# Patient Record
Sex: Female | Born: 1965 | Race: White | Hispanic: No | Marital: Married | Smoking: Never smoker
Health system: Southern US, Community
[De-identification: ages and names within clinical notes are randomized; demographics above are authoritative.]

## PROBLEM LIST (undated history)

## (undated) DIAGNOSIS — I10 Essential (primary) hypertension: Secondary | ICD-10-CM

## (undated) HISTORY — PX: ABDOMINOPLASTY: SUR9

---

## 2010-04-27 ENCOUNTER — Ambulatory Visit: Payer: Self-pay | Admitting: Gynecology

## 2010-04-29 ENCOUNTER — Ambulatory Visit: Payer: Self-pay | Admitting: Gynecology

## 2010-10-26 ENCOUNTER — Ambulatory Visit: Payer: Self-pay | Admitting: Cardiology

## 2013-02-10 ENCOUNTER — Ambulatory Visit (INDEPENDENT_AMBULATORY_CARE_PROVIDER_SITE_OTHER): Payer: 59 | Admitting: General Practice

## 2013-02-10 DIAGNOSIS — R300 Dysuria: Secondary | ICD-10-CM

## 2013-02-10 LAB — POCT URINALYSIS DIPSTICK
Bilirubin, UA: NEGATIVE
Glucose, UA: NEGATIVE
Ketones, UA: NEGATIVE
Leukocytes, UA: NEGATIVE
Nitrite, UA: NEGATIVE
Protein, UA: NEGATIVE
Spec Grav, UA: 1.02
Urobilinogen, UA: NEGATIVE
pH, UA: 6

## 2013-02-10 LAB — POCT UA - MICROSCOPIC ONLY
Bacteria, U Microscopic: NEGATIVE
Casts, Ur, LPF, POC: NEGATIVE
Crystals, Ur, HPF, POC: NEGATIVE
Mucus, UA: NEGATIVE
Yeast, UA: NEGATIVE

## 2013-02-11 NOTE — Progress Notes (Signed)
Patient ID: Bailey Valdez, female   DOB: 1965-11-12, 48 y.o.   MRN: [id number]  S: Patient complains of feeling of a suprapubic pressure sensation. She reports having a history of cystitis and would like to rule out UTI. Denies dizziness, frequency, urgency, hematuria, or foul smelling urine.   Results for orders placed in visit on 02/10/13  POCT URINALYSIS DIPSTICK      Result Value Range   Color, UA yellow     Clarity, UA clear     Glucose, UA neg     Bilirubin, UA neg     Ketones, UA neg     Spec Grav, UA 1.020     Blood, UA trace     pH, UA 6.0     Protein, UA neg     Urobilinogen, UA negative     Nitrite, UA neg     Leukocytes, UA Negative    POCT UA - MICROSCOPIC ONLY      Result Value Range   WBC, Ur, HPF, POC 1-3     RBC, urine, microscopic 1-3     Bacteria, U Microscopic neg     Mucus, UA neg     Epithelial cells, urine per micros occ     Crystals, Ur, HPF, POC neg     Casts, Ur, LPF, POC neg     Yeast, UA neg     1. Dysuria - POCT urinalysis dipstick - POCT UA - Microscopic Only  -increase fluids -RTO if symptoms worsen or unresolved, will consider antibiotic -Patient verbalized understanding Coralie Keens, FNP-C

## 2013-03-19 ENCOUNTER — Other Ambulatory Visit: Payer: Self-pay | Admitting: General Practice

## 2013-03-19 ENCOUNTER — Other Ambulatory Visit (INDEPENDENT_AMBULATORY_CARE_PROVIDER_SITE_OTHER): Payer: 59

## 2013-03-19 DIAGNOSIS — Z0000 Encounter for general adult medical examination without abnormal findings: Secondary | ICD-10-CM

## 2013-03-19 LAB — POCT CBC
Granulocyte percent: 62.5 %G (ref 37–80)
HCT, POC: 44.7 % (ref 37.7–47.9)
Hemoglobin: 14.6 g/dL (ref 12.2–16.2)
Lymph, poc: 1.8 (ref 0.6–3.4)
MCH, POC: 28.7 pg (ref 27–31.2)
MCHC: 32.8 g/dL (ref 31.8–35.4)
MCV: 87.7 fL (ref 80–97)
MPV: 8.2 fL (ref 0–99.8)
POC Granulocyte: 3.4 (ref 2–6.9)
POC LYMPH PERCENT: 34.1 %L (ref 10–50)
Platelet Count, POC: 230 10*3/uL (ref 142–424)
RBC: 5.1 M/uL (ref 4.04–5.48)
RDW, POC: 13.2 %
WBC: 5.4 10*3/uL (ref 4.6–10.2)

## 2013-03-19 NOTE — Progress Notes (Signed)
Patient came in for labs only.

## 2013-03-21 LAB — CMP14+EGFR
ALT: 28 IU/L (ref 0–32)
AST: 23 IU/L (ref 0–40)
Albumin/Globulin Ratio: 1.8 (ref 1.1–2.5)
Albumin: 4.5 g/dL (ref 3.5–5.5)
Alkaline Phosphatase: 71 IU/L (ref 39–117)
BUN/Creatinine Ratio: 20 (ref 9–23)
BUN: 17 mg/dL (ref 6–24)
CO2: 28 mmol/L (ref 18–29)
Calcium: 10 mg/dL (ref 8.7–10.2)
Chloride: 104 mmol/L (ref 97–108)
Creatinine, Ser: 0.83 mg/dL (ref 0.57–1.00)
GFR calc Af Amer: 96 mL/min/{1.73_m2} (ref >59)
GFR calc non Af Amer: 83 mL/min/{1.73_m2} (ref >59)
Globulin, Total: 2.5 g/dL (ref 1.5–4.5)
Glucose: 107 mg/dL — ABNORMAL HIGH (ref 65–99)
Potassium: 5.4 mmol/L — ABNORMAL HIGH (ref 3.5–5.2)
Sodium: 146 mmol/L — ABNORMAL HIGH (ref 134–144)
Total Bilirubin: 0.3 mg/dL (ref 0.0–1.2)
Total Protein: 7 g/dL (ref 6.0–8.5)

## 2013-03-21 LAB — NMR, LIPOPROFILE
Cholesterol: 169 mg/dL (ref <200)
HDL Cholesterol by NMR: 50 mg/dL (ref >=40)
HDL Particle Number: 34.8 umol/L (ref >=30.5)
LDL Particle Number: 1521 nmol/L — ABNORMAL HIGH (ref <1000)
LDL Size: 20.9 nm (ref >20.5)
LDLC SERPL CALC-MCNC: 104 mg/dL — ABNORMAL HIGH (ref <100)
LP-IR Score: 46 — ABNORMAL HIGH (ref <=45)
Small LDL Particle Number: 565 nmol/L — ABNORMAL HIGH (ref <=527)
Triglycerides by NMR: 73 mg/dL (ref <150)

## 2013-03-21 LAB — POCT GLYCOSYLATED HEMOGLOBIN (HGB A1C): Hemoglobin A1C: 5.2

## 2013-03-21 NOTE — Addendum Note (Signed)
Addended by: Orma Render F on: 03/21/2013 02:37 PM   Modules accepted: Orders

## 2013-03-25 ENCOUNTER — Other Ambulatory Visit: Payer: Self-pay | Admitting: General Practice

## 2013-03-25 DIAGNOSIS — H01133 Eczematous dermatitis of right eye, unspecified eyelid: Secondary | ICD-10-CM

## 2013-03-25 MED ORDER — BETAMETHASONE DIPROPIONATE AUG 0.05 % EX GEL: Freq: Two times a day (BID) | CUTANEOUS | Status: DC

## 2013-06-12 ENCOUNTER — Other Ambulatory Visit: Payer: Self-pay | Admitting: *Deleted

## 2013-06-12 MED ORDER — NITROFURANTOIN MACROCRYSTAL 100 MG PO CAPS: 100.0000 mg | ORAL_CAPSULE | Freq: Two times a day (BID) | ORAL | Status: DC

## 2013-07-16 ENCOUNTER — Encounter (INDEPENDENT_AMBULATORY_CARE_PROVIDER_SITE_OTHER): Payer: Self-pay

## 2013-07-16 ENCOUNTER — Ambulatory Visit (INDEPENDENT_AMBULATORY_CARE_PROVIDER_SITE_OTHER): Payer: 59 | Admitting: Family Medicine

## 2013-07-16 ENCOUNTER — Encounter: Payer: Self-pay | Admitting: Family Medicine

## 2013-07-16 ENCOUNTER — Ambulatory Visit (INDEPENDENT_AMBULATORY_CARE_PROVIDER_SITE_OTHER): Payer: 59

## 2013-07-16 VITALS — BP 132/74 | HR 88 | Temp 97.4°F | Ht 63.0 in | Wt 207.0 lb

## 2013-07-16 DIAGNOSIS — Z0000 Encounter for general adult medical examination without abnormal findings: Secondary | ICD-10-CM

## 2013-07-16 DIAGNOSIS — Z780 Asymptomatic menopausal state: Secondary | ICD-10-CM

## 2013-07-16 DIAGNOSIS — F4323 Adjustment disorder with mixed anxiety and depressed mood: Secondary | ICD-10-CM | POA: Insufficient documentation

## 2013-07-16 DIAGNOSIS — E8881 Metabolic syndrome: Secondary | ICD-10-CM

## 2013-07-16 DIAGNOSIS — E785 Hyperlipidemia, unspecified: Secondary | ICD-10-CM

## 2013-07-16 NOTE — Patient Instructions (Addendum)
Continue current medications. Continue good therapeutic lifestyle changes which include good diet and exercise. Fall precautions discussed with patient. If an FOBT was given today- please return it to our front desk. If you are over 50 years old - you may need Prevnar 13 or the adult Pneumonia vaccine.  We will plan to do lab work, EKG chest x-ray urinalysis, FOBT,----- after her labs are returned we will discuss diet options and weight loss measures.

## 2013-07-16 NOTE — Addendum Note (Signed)
Addended by: Magdalene RiverBULLINS, JAMIE H on: 07/16/2013 02:47 PM   Modules accepted: Orders

## 2013-07-16 NOTE — Progress Notes (Addendum)
Subjective:    Patient ID: Bailey Valdez, female    DOB: [birth year], 48 y.o.   MRN: [id number]  HPI Pt here for follow up and management of chronic medical problems. Patient has had pelvic and Pap smears which were normal this past month. She is going to be scheduled for a mammogram son. She is due to get a DEXA scan chest x-ray EKG and lab work. She is postmenopausal. Is a family history of heart disease, stroke, kidney stones rectal cancer and crohns. The patient is scheduled for an eye exam the end of the month and she gets regular dental checks.  The patient  is concerned about her weight. She has had a 15 pound weight gain since starting her antidepressant and .she does not want to change antidepressants because she is doing well on this medication.        There are no active problems to display for this patient.  Outpatient Encounter Prescriptions as of 07/16/2013  Medication Sig  . [DISCONTINUED] betamethasone, augmented, (DIPROLENE) 0.05 % gel Apply topically 2 (two) times daily.  . [DISCONTINUED] nitrofurantoin (MACRODANTIN) 100 MG capsule Take 1 capsule (100 mg total) by mouth 2 (two) times daily.    Review of Systems  Constitutional: Negative.   HENT: Negative.   Eyes: Negative.   Respiratory: Negative.   Cardiovascular: Negative.   Gastrointestinal: Negative.   Endocrine: Negative.   Genitourinary: Negative.   Musculoskeletal: Negative.   Skin: Negative.   Allergic/Immunologic: Negative.   Neurological: Negative.   Hematological: Negative.   Psychiatric/Behavioral: Negative.        Objective:   Physical Exam  Nursing note and vitals reviewed. Constitutional: She is oriented to person, place, and time. She appears well-developed and well-nourished. No distress.  HENT:  Head: Normocephalic and atraumatic.  Right Ear: External ear normal.  Left Ear: External ear normal.  Mouth/Throat: Oropharynx is clear and moist. No oropharyngeal exudate.  Congestion  bilaterally with minimal cerumen left greater than right  Eyes: Conjunctivae and EOM are normal. Pupils are equal, round, and reactive to light. Right eye exhibits no discharge. Left eye exhibits no discharge. No scleral icterus.  Neck: Normal range of motion. Neck supple. No thyromegaly present.  No carotid bruits auscultated  Cardiovascular: Normal rate, regular rhythm, normal heart sounds and intact distal pulses.  Exam reveals no gallop and no friction rub.   No murmur heard. At 96 per minute  Pulmonary/Chest: Effort normal and breath sounds normal. No respiratory distress. She has no wheezes. She has no rales. She exhibits no tenderness.  Abdominal: Soft. Bowel sounds are normal. She exhibits no mass. There is no tenderness. There is no rebound and no guarding.  Abdominal obesity  Musculoskeletal: Normal range of motion. She exhibits no edema and no tenderness.  Lymphadenopathy:    She has no cervical adenopathy.  Neurological: She is alert and oriented to person, place, and time. She has normal reflexes. No cranial nerve deficit.  Skin: Skin is warm and dry. No rash noted.  Psychiatric: She has a normal mood and affect. Her behavior is normal. Judgment and thought content normal.   BP 132/74  Pulse 88  Temp(Src) 97.4 F (36.3 C) (Oral)  Ht 5\' 3"  (1.6 m)  Wt 207 lb (93.895 kg)  BMI 36.68 kg/m2  [facility] reading (PRIMARY) by  Dr.Moore-chest x-ray-no active disease the thoracic spine appears somewhat osteopenic  Assessment & Plan:  1. Adjustment disorder with mixed anxiety and depressed mood  2. Hyperlipidemia  3. Metabolic syndrome  4. Physical exam, annual  Patient Instructions  Continue current medications. Continue good therapeutic lifestyle changes which include good diet and exercise. Fall precautions discussed with patient. If an FOBT was given today- please return it to our front desk. If you are over 50 years old - you may  need Prevnar 13 or the adult Pneumonia vaccine.  We will plan to do lab work, EKG chest x-ray urinalysis, FOBT,----- after her labs are returned we will discuss diet options and weight loss measures.   Nyra Capeson W. Moore MD

## 2013-07-17 ENCOUNTER — Other Ambulatory Visit: Payer: Self-pay | Admitting: *Deleted

## 2013-07-17 ENCOUNTER — Other Ambulatory Visit: Payer: Self-pay | Admitting: Family Medicine

## 2013-07-17 DIAGNOSIS — E785 Hyperlipidemia, unspecified: Secondary | ICD-10-CM

## 2013-07-17 DIAGNOSIS — Z0000 Encounter for general adult medical examination without abnormal findings: Secondary | ICD-10-CM

## 2013-07-17 DIAGNOSIS — F4323 Adjustment disorder with mixed anxiety and depressed mood: Secondary | ICD-10-CM

## 2013-07-17 DIAGNOSIS — Z780 Asymptomatic menopausal state: Secondary | ICD-10-CM

## 2013-07-17 DIAGNOSIS — E8881 Metabolic syndrome: Secondary | ICD-10-CM

## 2013-07-17 LAB — POCT CBC
Granulocyte percent: 63.6 %G (ref 37–80)
HCT, POC: 38.3 % (ref 37.7–47.9)
Hemoglobin: 14.4 g/dL (ref 12.2–16.2)
Lymph, poc: 1.7 (ref 0.6–3.4)
MCH, POC: 32.9 pg — AB (ref 27–31.2)
MCHC: 37.8 g/dL — AB (ref 31.8–35.4)
MCV: 87.1 fL (ref 80–97)
MPV: 8.4 fL (ref 0–99.8)
POC Granulocyte: 3.3 (ref 2–6.9)
POC LYMPH PERCENT: 32.1 %L (ref 10–50)
Platelet Count, POC: 197 10*3/uL (ref 142–424)
RBC: 4.4 M/uL (ref 4.04–5.48)
RDW, POC: 13.7 %
WBC: 5.2 10*3/uL (ref 4.6–10.2)

## 2013-07-17 NOTE — Addendum Note (Signed)
Addended by: Tommas OlpHANDY, ASHLEY N on: 07/17/2013 09:37 AM   Modules accepted: Orders

## 2013-07-18 LAB — HEPATIC FUNCTION PANEL
ALT: 34 IU/L — ABNORMAL HIGH (ref 0–32)
AST: 25 IU/L (ref 0–40)
Albumin: 4.3 g/dL (ref 3.5–5.5)
Alkaline Phosphatase: 73 IU/L (ref 39–117)
Bilirubin, Direct: 0.14 mg/dL (ref 0.00–0.40)
Total Bilirubin: 0.4 mg/dL (ref 0.0–1.2)
Total Protein: 6.9 g/dL (ref 6.0–8.5)

## 2013-07-18 LAB — THYROID PANEL
Free Thyroxine Index: 1.6 (ref 1.2–4.9)
T3 Uptake Ratio: 29 % (ref 24–39)
T4, Total: 5.6 ug/dL (ref 4.5–12.0)

## 2013-07-18 LAB — NMR, LIPOPROFILE
Cholesterol: 174 mg/dL (ref <200)
HDL Cholesterol by NMR: 46 mg/dL (ref >=40)
HDL Particle Number: 30.2 umol/L — ABNORMAL LOW (ref >=30.5)
LDL Particle Number: 1403 nmol/L — ABNORMAL HIGH (ref <1000)
LDL Size: 20.8 nm (ref >20.5)
LDLC SERPL CALC-MCNC: 108 mg/dL — ABNORMAL HIGH (ref <100)
LP-IR Score: 62 — ABNORMAL HIGH (ref <=45)
Small LDL Particle Number: 669 nmol/L — ABNORMAL HIGH (ref <=527)
Triglycerides by NMR: 100 mg/dL (ref <150)

## 2013-07-18 LAB — BMP8+EGFR
BUN/Creatinine Ratio: 15 (ref 9–23)
BUN: 13 mg/dL (ref 6–24)
CO2: 24 mmol/L (ref 18–29)
Calcium: 9.3 mg/dL (ref 8.7–10.2)
Chloride: 100 mmol/L (ref 97–108)
Creatinine, Ser: 0.84 mg/dL (ref 0.57–1.00)
GFR calc Af Amer: 94 mL/min/{1.73_m2} (ref >59)
GFR calc non Af Amer: 82 mL/min/{1.73_m2} (ref >59)
Glucose: 99 mg/dL (ref 65–99)
Potassium: 4.5 mmol/L (ref 3.5–5.2)
Sodium: 140 mmol/L (ref 134–144)

## 2013-07-18 LAB — VITAMIN D 25 HYDROXY (VIT D DEFICIENCY, FRACTURES): Vit D, 25-Hydroxy: 18.3 ng/mL — ABNORMAL LOW (ref 30.0–100.0)

## 2013-07-18 LAB — C-REACTIVE PROTEIN: CRP: 2.9 mg/L (ref 0.0–4.9)

## 2013-07-18 LAB — HEPATITIS C ANTIBODY: Hep C Virus Ab: <0.1 s/co ratio (ref 0.0–0.9)

## 2013-07-21 ENCOUNTER — Other Ambulatory Visit: Payer: Self-pay | Admitting: *Deleted

## 2013-07-21 MED ORDER — PHENTERMINE HCL 37.5 MG PO CAPS: 37.5000 mg | ORAL_CAPSULE | ORAL | Status: DC

## 2013-09-25 ENCOUNTER — Other Ambulatory Visit: Payer: Self-pay | Admitting: Orthopedic Surgery

## 2013-09-25 ENCOUNTER — Ambulatory Visit (INDEPENDENT_AMBULATORY_CARE_PROVIDER_SITE_OTHER): Payer: 59

## 2013-09-25 DIAGNOSIS — R52 Pain, unspecified: Secondary | ICD-10-CM

## 2013-09-25 DIAGNOSIS — M25519 Pain in unspecified shoulder: Secondary | ICD-10-CM

## 2013-10-08 ENCOUNTER — Ambulatory Visit: Payer: 59 | Attending: Orthopedic Surgery | Admitting: Physical Therapy

## 2013-10-08 DIAGNOSIS — M67919 Unspecified disorder of synovium and tendon, unspecified shoulder: Secondary | ICD-10-CM | POA: Diagnosis not present

## 2013-10-08 DIAGNOSIS — R5381 Other malaise: Secondary | ICD-10-CM | POA: Diagnosis not present

## 2013-10-08 DIAGNOSIS — M719 Bursopathy, unspecified: Secondary | ICD-10-CM | POA: Insufficient documentation

## 2013-10-08 DIAGNOSIS — IMO0001 Reserved for inherently not codable concepts without codable children: Secondary | ICD-10-CM | POA: Diagnosis present

## 2013-10-08 DIAGNOSIS — M25519 Pain in unspecified shoulder: Secondary | ICD-10-CM | POA: Insufficient documentation

## 2013-10-13 ENCOUNTER — Ambulatory Visit: Payer: 59 | Attending: Orthopedic Surgery | Admitting: Physical Therapy

## 2013-10-13 DIAGNOSIS — R5381 Other malaise: Secondary | ICD-10-CM | POA: Diagnosis not present

## 2013-10-13 DIAGNOSIS — M25519 Pain in unspecified shoulder: Secondary | ICD-10-CM | POA: Insufficient documentation

## 2013-10-13 DIAGNOSIS — IMO0001 Reserved for inherently not codable concepts without codable children: Secondary | ICD-10-CM | POA: Insufficient documentation

## 2013-10-13 DIAGNOSIS — M67919 Unspecified disorder of synovium and tendon, unspecified shoulder: Secondary | ICD-10-CM | POA: Diagnosis not present

## 2013-10-13 DIAGNOSIS — M719 Bursopathy, unspecified: Secondary | ICD-10-CM | POA: Insufficient documentation

## 2013-10-16 ENCOUNTER — Ambulatory Visit: Payer: 59 | Admitting: Physical Therapy

## 2013-10-16 DIAGNOSIS — IMO0001 Reserved for inherently not codable concepts without codable children: Secondary | ICD-10-CM | POA: Diagnosis not present

## 2013-10-27 ENCOUNTER — Ambulatory Visit: Payer: 59 | Admitting: Physical Therapy

## 2013-10-27 DIAGNOSIS — IMO0001 Reserved for inherently not codable concepts without codable children: Secondary | ICD-10-CM | POA: Diagnosis not present

## 2013-10-30 ENCOUNTER — Ambulatory Visit: Payer: 59 | Admitting: Physical Therapy

## 2013-10-30 ENCOUNTER — Encounter: Payer: 59 | Admitting: Physical Therapy

## 2013-10-30 DIAGNOSIS — IMO0001 Reserved for inherently not codable concepts without codable children: Secondary | ICD-10-CM | POA: Diagnosis not present

## 2013-11-04 ENCOUNTER — Ambulatory Visit: Payer: 59 | Admitting: Physical Therapy

## 2013-11-04 DIAGNOSIS — IMO0001 Reserved for inherently not codable concepts without codable children: Secondary | ICD-10-CM | POA: Diagnosis not present

## 2013-11-06 ENCOUNTER — Ambulatory Visit: Payer: 59 | Admitting: Physical Therapy

## 2013-11-06 DIAGNOSIS — IMO0001 Reserved for inherently not codable concepts without codable children: Secondary | ICD-10-CM | POA: Diagnosis not present

## 2013-11-11 ENCOUNTER — Ambulatory Visit: Payer: 59 | Admitting: Physical Therapy

## 2013-11-11 DIAGNOSIS — IMO0001 Reserved for inherently not codable concepts without codable children: Secondary | ICD-10-CM | POA: Diagnosis not present

## 2013-12-22 ENCOUNTER — Telehealth: Payer: Self-pay | Admitting: *Deleted

## 2013-12-22 MED ORDER — NITROFURANTOIN MONOHYD MACRO 100 MG PO CAPS: 100.0000 mg | ORAL_CAPSULE | Freq: Every day | ORAL | Status: DC

## 2013-12-22 NOTE — Telephone Encounter (Signed)
Pt has UTI - [facility]

## 2013-12-24 ENCOUNTER — Other Ambulatory Visit: Payer: Self-pay | Admitting: *Deleted

## 2013-12-24 MED ORDER — FLUCONAZOLE 150 MG PO TABS: ORAL_TABLET | ORAL | Status: DC

## 2013-12-25 DIAGNOSIS — N390 Urinary tract infection, site not specified: Secondary | ICD-10-CM

## 2013-12-26 DIAGNOSIS — N390 Urinary tract infection, site not specified: Secondary | ICD-10-CM

## 2014-01-02 ENCOUNTER — Other Ambulatory Visit: Payer: 59 | Admitting: *Deleted

## 2014-01-02 DIAGNOSIS — N390 Urinary tract infection, site not specified: Secondary | ICD-10-CM

## 2014-01-02 MED ORDER — CEFTRIAXONE SODIUM 500 MG IJ SOLR
500.0000 mg | Freq: Every day | INTRAMUSCULAR | Status: AC
  Administered 2013-12-25 – 2013-12-26 (×2): 500 mg via INTRAMUSCULAR

## 2014-01-02 MED ORDER — CEFTRIAXONE SODIUM 500 MG IJ SOLR: 500.0000 mg | Freq: Every day | INTRAMUSCULAR | Status: AC

## 2014-03-12 ENCOUNTER — Other Ambulatory Visit: Payer: Self-pay | Admitting: *Deleted

## 2014-03-12 MED ORDER — TOBRAMYCIN 0.3 % OP OINT: 1.0000 "application " | TOPICAL_OINTMENT | Freq: Three times a day (TID) | OPHTHALMIC | Status: DC

## 2014-03-12 NOTE — Progress Notes (Signed)
Possible pink eye rx sent to pharm

## 2014-04-02 ENCOUNTER — Encounter: Payer: Self-pay | Admitting: Internal Medicine

## 2014-04-02 ENCOUNTER — Other Ambulatory Visit: Payer: Self-pay

## 2014-04-02 DIAGNOSIS — Z1211 Encounter for screening for malignant neoplasm of colon: Secondary | ICD-10-CM

## 2014-06-02 ENCOUNTER — Ambulatory Visit (AMBULATORY_SURGERY_CENTER): Payer: Self-pay

## 2014-06-02 VITALS — Ht 63.0 in | Wt 213.0 lb

## 2014-06-02 DIAGNOSIS — Z800 Family history of malignant neoplasm of digestive organs: Secondary | ICD-10-CM

## 2014-06-02 NOTE — Progress Notes (Signed)
Per pt, no allergies to soy or egg products.Pt not taking any weight loss meds or using  O2 at home. 

## 2014-06-16 ENCOUNTER — Encounter: Payer: Self-pay | Admitting: Internal Medicine

## 2014-06-16 ENCOUNTER — Ambulatory Visit (AMBULATORY_SURGERY_CENTER): Payer: 59 | Admitting: Internal Medicine

## 2014-06-16 DIAGNOSIS — Z1211 Encounter for screening for malignant neoplasm of colon: Secondary | ICD-10-CM

## 2014-06-16 MED ORDER — SODIUM CHLORIDE 0.9 % IV SOLN: 500.0000 mL | INTRAVENOUS | Status: DC

## 2014-06-16 NOTE — Op Note (Signed)
[facility] [address] [place], [id number]   COLONOSCOPY PROCEDURE REPORT  PATIENT: Bailey HeathMartin, Kariann M  MR#: [id number] BIRTHDATE: 03-17-1966 , 48  yrs. old GENDER: female ENDOSCOPIST: Iva Booparl E Gessner, MD, [facility] PROCEDURE DATE:  06/16/2014 PROCEDURE:   Colonoscopy, screening First Screening Colonoscopy - Avg.  risk and is 50 yrs.  old or older Yes.  Prior Negative Screening - Now for repeat screening. N/A  History of Adenoma - Now for follow-up colonoscopy & has been > or = to 3 yrs.  N/A  Polyps Removed Today? No.  Polyps Removed Today? No.  Recommend repeat exam, <10 yrs? Polyps Removed Today? No.  Recommend repeat exam, <10 yrs? No. ASA CLASS:   Class II INDICATIONS:first colonoscopy and average risk for colorectal cancer. MEDICATIONS: Propofol 300 mg IV and Monitored anesthesia care  DESCRIPTION OF PROCEDURE:   After the risks benefits and alternatives of the procedure were thoroughly explained, informed consent was obtained.  The digital rectal exam revealed no abnormalities of the rectum.   The LB [id number]-H190 [id number]  endoscope was introduced through the anus and advanced to the cecum, which was identified by both the appendix and ileocecal valve. No adverse events experienced.   The quality of the prep was excellent, using MiraLax  The instrument was then slowly withdrawn as the colon was fully examined.      COLON FINDINGS: A normal appearing cecum, ileocecal valve, and appendiceal orifice were identified.  The ascending, transverse, descending, sigmoid colon, and rectum appeared unremarkable. Retroflexed views revealed no abnormalities. The time to cecum=5 minutes 18 seconds.  Withdrawal time=10 minutes 16 seconds.  The scope was withdrawn and the procedure completed. COMPLICATIONS: There were no immediate complications.  ENDOSCOPIC IMPRESSION: Normal colonoscopy - excellent prep  RECOMMENDATIONS: Repeat Colonscopy in 10 years -2026  eSigned:   Iva Booparl E Gessner, MD, [facility] 06/16/2014 10:16 AM   cc: the Patient and Rudi Heaponald Moore, MD

## 2014-06-16 NOTE — Patient Instructions (Addendum)
Your colonoscopy was normal!  Next routine colonoscopy in 10 years - 2026  I appreciate the opportunity to care for you. Iva Booparl E. Gessner, MD, [facility]   Discharge instructions given. Normal exam. Resume previous medications. YOU HAD AN ENDOSCOPIC PROCEDURE TODAY AT THE [facility]: Refer to the procedure report that was given to you for any specific questions about what was found during the examination.  If the procedure report does not answer your questions, please call your gastroenterologist to clarify.  If you requested that your care partner not be given the details of your procedure findings, then the procedure report has been included in a sealed envelope for you to review at your convenience later.  YOU SHOULD EXPECT: Some feelings of bloating in the abdomen. Passage of more gas than usual.  Walking can help get rid of the air that was put into your GI tract during the procedure and reduce the bloating. If you had a lower endoscopy (such as a colonoscopy or flexible sigmoidoscopy) you may notice spotting of blood in your stool or on the toilet paper. If you underwent a bowel prep for your procedure, then you may not have a normal bowel movement for a few days.  DIET: Your first meal following the procedure should be a light meal and then it is ok to progress to your normal diet.  A half-sandwich or bowl of soup is an example of a good first meal.  Heavy or fried foods are harder to digest and may make you feel nauseous or bloated.  Likewise meals heavy in dairy and vegetables can cause extra gas to form and this can also increase the bloating.  Drink plenty of fluids but you should avoid alcoholic beverages for 24 hours.  ACTIVITY: Your care partner should take you home directly after the procedure.  You should plan to take it easy, moving slowly for the rest of the day.  You can resume normal activity the day after the procedure however you should NOT DRIVE or use heavy  machinery for 24 hours (because of the sedation medicines used during the test).    SYMPTOMS TO REPORT IMMEDIATELY: A gastroenterologist can be reached at any hour.  During normal business hours, 8:30 AM to 5:00 PM Monday through Friday, call [phone].  After hours and on weekends, please call the GI answering service at [phone] who will take a message and have the physician on call contact you.   Following lower endoscopy (colonoscopy or flexible sigmoidoscopy):  Excessive amounts of blood in the stool  Significant tenderness or worsening of abdominal pains  Swelling of the abdomen that is new, acute  Fever of 100F or higher  FOLLOW UP: If any biopsies were taken you will be contacted by phone or by letter within the next 1-3 weeks.  Call your gastroenterologist if you have not heard about the biopsies in 3 weeks.  Our staff will call the home number listed on your records the next business day following your procedure to check on you and address any questions or concerns that you may have at that time regarding the information given to you following your procedure. This is a courtesy call and so if there is no answer at the home number and we have not heard from you through the emergency physician on call, we will assume that you have returned to your regular daily activities without incident.  SIGNATURES/CONFIDENTIALITY: You and/or your care partner have signed paperwork which will  be entered into your electronic medical record.  These signatures attest to the fact that that the information above on your After Visit Summary has been reviewed and is understood.  Full responsibility of the confidentiality of this discharge information lies with you and/or your care-partner. 

## 2014-06-16 NOTE — Progress Notes (Signed)
Report to PACU, RN, vss, BBS= Clear.  

## 2014-06-17 ENCOUNTER — Telehealth: Payer: Self-pay | Admitting: *Deleted

## 2014-06-17 NOTE — Telephone Encounter (Signed)
  Follow up Call-  Call back number 06/16/2014  Post procedure Call Back phone  # [phone]  Permission to leave phone message Yes     Patient questions:  Do you have a fever, pain , or abdominal swelling? No. Pain Score  0 *  Have you tolerated food without any problems? Yes.    Have you been able to return to your normal activities? Yes.    Do you have any questions about your discharge instructions: Diet   No. Medications  No. Follow up visit  No.  Do you have questions or concerns about your Care? No.  Actions: * If pain score is 4 or above: No action needed, pain <4.

## 2014-09-01 ENCOUNTER — Other Ambulatory Visit: Payer: Self-pay | Admitting: *Deleted

## 2014-09-01 MED ORDER — TRIAMCINOLONE ACETONIDE 55 MCG/ACT NA AERO: 2.0000 | INHALATION_SPRAY | Freq: Every day | NASAL | Status: DC

## 2014-09-01 MED ORDER — FLUTICASONE PROPIONATE 50 MCG/ACT NA SUSP: 2.0000 | Freq: Every day | NASAL | Status: DC

## 2014-10-16 ENCOUNTER — Other Ambulatory Visit: Payer: 59

## 2014-10-16 ENCOUNTER — Other Ambulatory Visit: Payer: Self-pay | Admitting: *Deleted

## 2014-10-16 DIAGNOSIS — R21 Rash and other nonspecific skin eruption: Secondary | ICD-10-CM

## 2014-10-18 LAB — AEROBIC CULTURE

## 2014-10-29 ENCOUNTER — Other Ambulatory Visit: Payer: Self-pay

## 2014-10-29 DIAGNOSIS — M3399 Dermatopolymyositis, unspecified with other organ involvement: Principal | ICD-10-CM

## 2014-10-29 DIAGNOSIS — M3390 Dermatopolymyositis, unspecified, organ involvement unspecified: Secondary | ICD-10-CM

## 2015-03-09 ENCOUNTER — Other Ambulatory Visit: Payer: Self-pay | Admitting: *Deleted

## 2015-03-09 DIAGNOSIS — R399 Unspecified symptoms and signs involving the genitourinary system: Secondary | ICD-10-CM

## 2015-03-19 ENCOUNTER — Other Ambulatory Visit: Payer: Self-pay | Admitting: *Deleted

## 2015-03-19 MED ORDER — FLUCONAZOLE 150 MG PO TABS: 150.0000 mg | ORAL_TABLET | Freq: Once | ORAL | Status: DC

## 2015-03-26 ENCOUNTER — Other Ambulatory Visit: Payer: 59

## 2015-03-26 ENCOUNTER — Other Ambulatory Visit: Payer: Self-pay | Admitting: *Deleted

## 2015-03-26 DIAGNOSIS — N390 Urinary tract infection, site not specified: Secondary | ICD-10-CM

## 2015-03-26 MED ORDER — MACROBID 100 MG PO CAPS: 100.0000 mg | ORAL_CAPSULE | Freq: Two times a day (BID) | ORAL | Status: DC

## 2015-03-26 NOTE — Progress Notes (Signed)
lab only.

## 2015-03-27 LAB — URINE CULTURE

## 2015-05-20 ENCOUNTER — Telehealth: Payer: Self-pay | Admitting: Pediatrics

## 2015-05-20 DIAGNOSIS — R399 Unspecified symptoms and signs involving the genitourinary system: Secondary | ICD-10-CM

## 2015-05-20 MED ORDER — MACROBID 100 MG PO CAPS: 100.0000 mg | ORAL_CAPSULE | Freq: Two times a day (BID) | ORAL | Status: DC

## 2015-05-20 NOTE — Telephone Encounter (Signed)
UTI symptoms starting yesterday, similar to prior UTI symptoms.

## 2015-05-23 ENCOUNTER — Other Ambulatory Visit: Payer: Self-pay | Admitting: Family

## 2015-05-23 MED ORDER — CIPROFLOXACIN HCL 500 MG PO TABS: 500.0000 mg | ORAL_TABLET | Freq: Two times a day (BID) | ORAL | Status: DC

## 2015-05-23 MED ORDER — FLUCONAZOLE 150 MG PO TABS: 150.0000 mg | ORAL_TABLET | ORAL | Status: DC

## 2015-07-08 ENCOUNTER — Other Ambulatory Visit: Payer: Self-pay | Admitting: Family

## 2015-07-08 MED ORDER — PHENTERMINE HCL 37.5 MG PO CAPS: 37.5000 mg | ORAL_CAPSULE | ORAL | Status: DC

## 2015-08-05 ENCOUNTER — Ambulatory Visit (INDEPENDENT_AMBULATORY_CARE_PROVIDER_SITE_OTHER): Payer: 59

## 2015-08-05 ENCOUNTER — Ambulatory Visit (INDEPENDENT_AMBULATORY_CARE_PROVIDER_SITE_OTHER): Payer: 59 | Admitting: Pediatrics

## 2015-08-05 ENCOUNTER — Encounter: Payer: Self-pay | Admitting: Pediatrics

## 2015-08-05 VITALS — BP 114/72 | HR 87 | Temp 97.0°F | Ht 63.0 in | Wt 194.0 lb

## 2015-08-05 DIAGNOSIS — M545 Low back pain: Secondary | ICD-10-CM | POA: Diagnosis not present

## 2015-08-05 LAB — URINALYSIS, COMPLETE
Bilirubin, UA: NEGATIVE
Glucose, UA: NEGATIVE
Ketones, UA: NEGATIVE
Nitrite, UA: NEGATIVE
Protein, UA: NEGATIVE
Specific Gravity, UA: 1.03 (ref 1.005–1.030)
Urobilinogen, Ur: 0.2 mg/dL (ref 0.2–1.0)
pH, UA: 5.5 (ref 5.0–7.5)

## 2015-08-05 LAB — MICROSCOPIC EXAMINATION: Epithelial Cells (non renal): >10 /hpf — AB (ref 0–10)

## 2015-08-05 MED ORDER — METHOCARBAMOL 500 MG PO TABS: 500.0000 mg | ORAL_TABLET | Freq: Three times a day (TID) | ORAL | Status: DC | PRN

## 2015-08-05 NOTE — Addendum Note (Signed)
Addended by: Johna SheriffVINCENT, Gwynn Crossley L on: 08/05/2015 02:22 PM   Modules accepted: Orders

## 2015-08-05 NOTE — Progress Notes (Addendum)
    Subjective:    Patient ID: Wardell HeathMary M Supan, female    DOB: 02/29/1966, 50 y.o.   MRN: [id number]  CC: Back Pain   HPI: Wardell HeathMary M Gentile is a 50 y.o. female presenting for Back Pain  Woke her up at 3am with pain, has been constant since then Present R flank, pain remits slightly when she presses on it Sometimes position changes help slightly 6/10 pain now No fevers, no dysuria, normal appetite, no nausea Occasionally has sharp shooting pain but otherwise just constant pain with no relief since starting hasnt taken anything yet for pain No personal h/o kidney stones, pt's mother with kidney stones Does have interstitial cystitis, oftne with RBCs in urine Regularly working out, last a few days ago, doesn't remember hurting back No pain shooting down leg    Relevant past medical, surgical, family and social history reviewed and updated as indicated. Interim medical history since our last visit reviewed. Allergies and medications reviewed and updated.  ROS: Per HPI unless specifically indicated above      Objective:    BP 114/72 mmHg  Pulse 87  Temp(Src) 97 F (36.1 C) (Oral)  Ht 5\' 3"  (1.6 m)  Wt 194 lb (87.998 kg)  BMI 34.37 kg/m2  Wt Readings from Last 3 Encounters:  08/05/15 194 lb (87.998 kg)  06/16/14 213 lb (96.616 kg)  06/02/14 213 lb (96.616 kg)     Gen: NAD, alert, cooperative with exam, NCAT EYES: EOMI, no scleral injection or icterus CV: WWP Resp:  normal WOB Abd: soft, NTND. no guarding or organomegaly, no peritoneal signs, + R CVA tenderness, neg psoas/obturator Ext: No edema, warm Neuro: Alert and oriented, strength LE normal MSK: normal ROM back, no worsening of pain with movement, neg SLR b/l     Assessment & Plan:    Corrie DandyMary was seen today for R flank pain. Will get KUB, test urine for infection. Try muscle relaxer, NSAID tonight, CT scan renal to look for stone if no improvement by tomorrow.  UA with some RBCs, 10-30 WBCs, will send for  culture.  Diagnoses and all orders for this visit:  Low back pain, unspecified back pain laterality, with sciatica presence unspecified -     Urinalysis, Complete -     DG Abd 1 View; Future  toradol 60mg  IM given in clinic Robaxin 500mg  #30 tabs take 1-2 q8h prn sent in  Follow up plan: As needed  Rex Krasarol Vincent, MD [facility] 08/05/2015, 1:04 PM

## 2015-08-05 NOTE — Addendum Note (Signed)
Addended by: Johna SheriffVINCENT, CAROL L on: 08/05/2015 03:16 PM   Modules accepted: Orders

## 2015-08-06 LAB — URINE CULTURE

## 2015-11-25 ENCOUNTER — Other Ambulatory Visit: Payer: Self-pay | Admitting: Family

## 2015-11-25 DIAGNOSIS — R399 Unspecified symptoms and signs involving the genitourinary system: Secondary | ICD-10-CM

## 2015-11-25 MED ORDER — MACROBID 100 MG PO CAPS: 100.0000 mg | ORAL_CAPSULE | Freq: Two times a day (BID) | ORAL | Status: DC

## 2015-12-03 ENCOUNTER — Encounter: Payer: Self-pay | Admitting: Family

## 2015-12-03 ENCOUNTER — Ambulatory Visit (INDEPENDENT_AMBULATORY_CARE_PROVIDER_SITE_OTHER): Payer: 59 | Admitting: Family

## 2015-12-03 VITALS — BP 128/84 | HR 92 | Temp 97.6°F | Ht 63.0 in | Wt 198.0 lb

## 2015-12-03 DIAGNOSIS — F4323 Adjustment disorder with mixed anxiety and depressed mood: Secondary | ICD-10-CM

## 2015-12-03 DIAGNOSIS — Z01419 Encounter for gynecological examination (general) (routine) without abnormal findings: Secondary | ICD-10-CM | POA: Diagnosis not present

## 2015-12-03 DIAGNOSIS — Z0000 Encounter for general adult medical examination without abnormal findings: Secondary | ICD-10-CM | POA: Diagnosis not present

## 2015-12-03 DIAGNOSIS — E669 Obesity, unspecified: Secondary | ICD-10-CM | POA: Insufficient documentation

## 2015-12-03 DIAGNOSIS — Z1239 Encounter for other screening for malignant neoplasm of breast: Secondary | ICD-10-CM

## 2015-12-03 DIAGNOSIS — E8881 Metabolic syndrome: Secondary | ICD-10-CM

## 2015-12-03 DIAGNOSIS — E785 Hyperlipidemia, unspecified: Secondary | ICD-10-CM

## 2015-12-03 MED ORDER — LIRAGLUTIDE -WEIGHT MANAGEMENT 18 MG/3ML ~~LOC~~ SOPN: 3.0000 mg | PEN_INJECTOR | Freq: Every day | SUBCUTANEOUS | Status: DC

## 2015-12-03 MED ORDER — PEN NEEDLES 31G X 6 MM MISC: 1.0000 | Freq: Every day | Status: DC

## 2015-12-03 NOTE — Progress Notes (Signed)
Subjective:    Patient ID: Bailey Valdez, female    DOB: 1966-03-10, 50 y.o.   MRN: [id number]  PT present to the office today for CPE with pap.  Gynecologic Exam The patient's pertinent negatives include no vaginal discharge. Pertinent negatives include no headaches.  Anxiety Presents for follow-up visit. Onset was 1 to 5 years ago. The problem has been waxing and waning. Patient reports no decreased concentration, depressed mood, excessive worry, insomnia, nervous/anxious behavior, palpitations, restlessness or shortness of breath. Symptoms occur occasionally. The severity of symptoms is moderate. The quality of sleep is good.   Her past medical history is significant for anxiety/panic attacks. Past treatments include SSRIs. The treatment provided moderate relief. Compliance with prior treatments has been good.  Hyperlipidemia This is a chronic problem. The current episode started more than 1 year ago. The problem is controlled. Recent lipid tests were reviewed and are normal. Exacerbating diseases include obesity. Pertinent negatives include no shortness of breath. Current antihyperlipidemic treatment includes diet change. The current treatment provides moderate improvement of lipids. Risk factors for coronary artery disease include dyslipidemia, obesity, a sedentary lifestyle and family history.      Review of Systems  Constitutional: Negative.   HENT: Negative.   Eyes: Negative.   Respiratory: Negative.  Negative for shortness of breath.   Cardiovascular: Negative.  Negative for palpitations.  Gastrointestinal: Negative.   Endocrine: Negative.   Genitourinary: Negative.  Negative for vaginal discharge.  Musculoskeletal: Negative.   Neurological: Negative.  Negative for headaches.  Hematological: Negative.   Psychiatric/Behavioral: Negative.  Negative for decreased concentration. The patient is not nervous/anxious and does not have insomnia.   All other systems reviewed and are  negative.      Objective:   Physical Exam  Constitutional: She is oriented to person, place, and time. She appears well-developed and well-nourished. No distress.  HENT:  Head: Normocephalic and atraumatic.  Right Ear: External ear normal.  Left Ear: External ear normal.  Nose: Nose normal.  Mouth/Throat: Oropharynx is clear and moist.  Eyes: Pupils are equal, round, and reactive to light.  Neck: Normal range of motion. Neck supple. No thyromegaly present.  Cardiovascular: Normal rate, regular rhythm, normal heart sounds and intact distal pulses.   No murmur heard. Pulmonary/Chest: Effort normal and breath sounds normal. No respiratory distress. She has no wheezes. Right breast exhibits no inverted nipple, no mass, no nipple discharge, no skin change and no tenderness. Left breast exhibits no inverted nipple, no mass, no nipple discharge, no skin change and no tenderness. Breasts are symmetrical.  Abdominal: Soft. Bowel sounds are normal. She exhibits no distension. There is no tenderness.  Genitourinary: Vagina normal.  Bimanual exam- no adnexal masses or tenderness, ovaries nonpalpable   Cervix parous and pink- No discharge   Musculoskeletal: Normal range of motion. She exhibits no edema or tenderness.  Neurological: She is alert and oriented to person, place, and time. She has normal reflexes. No cranial nerve deficit.  Skin: Skin is warm and dry.  Psychiatric: She has a normal mood and affect. Her behavior is normal. Judgment and thought content normal.  Vitals reviewed.     BP 128/84 mmHg  Pulse 92  Temp(Src) 97.6 F (36.4 C) (Oral)  Ht _0  (1.6 m)  Wt 198 lb (89.812 kg)  BMI 35.08 kg/m2     Assessment & Plan:  1. Adjustment disorder with mixed anxiety and depressed mood - CMP14+EGFR  2. Hyperlipidemia - RWE31+VQMG  3. Metabolic syndrome -  CMP14+EGFR - Liraglutide -Weight Management (SAXENDA) 18 MG/3ML SOPN; Inject 3 mg into the skin daily.  Dispense: 15 mL;  Refill: 1 - Insulin Pen Needle (PEN NEEDLES) 31G X 6 MM MISC; 1 each by Does not apply route daily.  Dispense: 100 each; Refill: 0  4. Obesity (BMI 30-39.9) - CMP14+EGFR - Liraglutide -Weight Management (SAXENDA) 18 MG/3ML SOPN; Inject 3 mg into the skin daily.  Dispense: 15 mL; Refill: 1 - Insulin Pen Needle (PEN NEEDLES) 31G X 6 MM MISC; 1 each by Does not apply route daily.  Dispense: 100 each; Refill: 0  5. Annual physical exam - Anemia Profile B - CMP14+EGFR - Lipid panel - Thyroid Panel With TSH - VITAMIN D 25 Hydroxy (Vit-D Deficiency, Fractures) - Pap IG w/ reflex to HPV when ASC-U  6. Encounter for routine gynecological examination - CMP14+EGFR - Pap IG w/ reflex to HPV when ASC-U  7. Breast cancer screening - [facility] MAMMOGRAPHY   Continue all meds Labs pending Health Maintenance reviewed Diet and exercise encouraged RTO 1 year  Evelina Dun, FNP

## 2015-12-03 NOTE — Patient Instructions (Signed)
Health Maintenance, Female Adopting a healthy lifestyle and getting preventive care can go a long way to promote health and wellness. Talk with your health care provider about what schedule of regular examinations is right for you. This is a good chance for you to check in with your provider about disease prevention and staying healthy. In between checkups, there are plenty of things you can do on your own. Experts have done a lot of research about which lifestyle changes and preventive measures are most likely to keep you healthy. Ask your health care provider for more information. WEIGHT AND DIET  Eat a healthy diet  Be sure to include plenty of vegetables, fruits, low-fat dairy products, and lean protein.  Do not eat a lot of foods high in solid fats, added sugars, or salt.  Get regular exercise. This is one of the most important things you can do for your health.  Most adults should exercise for at least 150 minutes each week. The exercise should increase your heart rate and make you sweat (moderate-intensity exercise).  Most adults should also do strengthening exercises at least twice a week. This is in addition to the moderate-intensity exercise.  Maintain a healthy weight  Body mass index (BMI) is a measurement that can be used to identify possible weight problems. It estimates body fat based on height and weight. Your health care provider can help determine your BMI and help you achieve or maintain a healthy weight.  For females 20 years of age and older:   A BMI below 18.5 is considered underweight.  A BMI of 18.5 to 24.9 is normal.  A BMI of 25 to 29.9 is considered overweight.  A BMI of 30 and above is considered obese.  Watch levels of cholesterol and blood lipids  You should start having your blood tested for lipids and cholesterol at 52 years of age, then have this test every 5 years.  You may need to have your cholesterol levels checked more often if:  Your lipid  or cholesterol levels are high.  You are older than 52 years of age.  You are at high risk for heart disease.  CANCER SCREENING   Lung Cancer  Lung cancer screening is recommended for adults 55-80 years old who are at high risk for lung cancer because of a history of smoking.  A yearly low-dose CT scan of the lungs is recommended for people who:  Currently smoke.  Have quit within the past 15 years.  Have at least a 30-pack-year history of smoking. A pack year is smoking an average of one pack of cigarettes a day for 1 year.  Yearly screening should continue until it has been 15 years since you quit.  Yearly screening should stop if you develop a health problem that would prevent you from having lung cancer treatment.  Breast Cancer  Practice breast self-awareness. This means understanding how your breasts normally appear and feel.  It also means doing regular breast self-exams. Let your health care provider know about any changes, no matter how small.  If you are in your 20s or 30s, you should have a clinical breast exam (CBE) by a health care provider every 1-3 years as part of a regular health exam.  If you are 40 or older, have a CBE every year. Also consider having a breast X-ray (mammogram) every year.  If you have a family history of breast cancer, talk to your health care provider about genetic screening.  If you   are at high risk for breast cancer, talk to your health care provider about having an MRI and a mammogram every year.  Breast cancer gene (BRCA) assessment is recommended for women who have family members with BRCA-related cancers. BRCA-related cancers include:  Breast.  Ovarian.  Tubal.  Peritoneal cancers.  Results of the assessment will determine the need for genetic counseling and BRCA1 and BRCA2 testing. Cervical Cancer Your health care provider may recommend that you be screened regularly for cancer of the pelvic organs (ovaries, uterus, and  vagina). This screening involves a pelvic examination, including checking for microscopic changes to the surface of your cervix (Pap test). You may be encouraged to have this screening done every 3 years, beginning at age 21.  For women ages 30-65, health care providers may recommend pelvic exams and Pap testing every 3 years, or they may recommend the Pap and pelvic exam, combined with testing for human papilloma virus (HPV), every 5 years. Some types of HPV increase your risk of cervical cancer. Testing for HPV may also be done on women of any age with unclear Pap test results.  Other health care providers may not recommend any screening for nonpregnant women who are considered low risk for pelvic cancer and who do not have symptoms. Ask your health care provider if a screening pelvic exam is right for you.  If you have had past treatment for cervical cancer or a condition that could lead to cancer, you need Pap tests and screening for cancer for at least 20 years after your treatment. If Pap tests have been discontinued, your risk factors (such as having a new sexual partner) need to be reassessed to determine if screening should resume. Some women have medical problems that increase the chance of getting cervical cancer. In these cases, your health care provider may recommend more frequent screening and Pap tests. Colorectal Cancer  This type of cancer can be detected and often prevented.  Routine colorectal cancer screening usually begins at 52 years of age and continues through 52 years of age.  Your health care provider may recommend screening at an earlier age if you have risk factors for colon cancer.  Your health care provider may also recommend using home test kits to check for hidden blood in the stool.  A small camera at the end of a tube can be used to examine your colon directly (sigmoidoscopy or colonoscopy). This is done to check for the earliest forms of colorectal  cancer.  Routine screening usually begins at age 50.  Direct examination of the colon should be repeated every 5-10 years through 52 years of age. However, you may need to be screened more often if early forms of precancerous polyps or small growths are found. Skin Cancer  Check your skin from head to toe regularly.  Tell your health care provider about any new moles or changes in moles, especially if there is a change in a mole's shape or color.  Also tell your health care provider if you have a mole that is larger than the size of a pencil eraser.  Always use sunscreen. Apply sunscreen liberally and repeatedly throughout the day.  Protect yourself by wearing long sleeves, pants, a wide-brimmed hat, and sunglasses whenever you are outside. HEART DISEASE, DIABETES, AND HIGH BLOOD PRESSURE   High blood pressure causes heart disease and increases the risk of stroke. High blood pressure is more likely to develop in:  People who have blood pressure in the high end   of the normal range (130-139/85-89 mm Hg).  People who are overweight or obese.  People who are African American.  If you are 38-23 years of age, have your blood pressure checked every 3-5 years. If you are 61 years of age or older, have your blood pressure checked every year. You should have your blood pressure measured twice--once when you are at a hospital or clinic, and once when you are not at a hospital or clinic. Record the average of the two measurements. To check your blood pressure when you are not at a hospital or clinic, you can use:  An automated blood pressure machine at a pharmacy.  A home blood pressure monitor.  If you are between 45 years and 39 years old, ask your health care provider if you should take aspirin to prevent strokes.  Have regular diabetes screenings. This involves taking a blood sample to check your fasting blood sugar level.  If you are at a normal weight and have a low risk for diabetes,  have this test once every three years after 52 years of age.  If you are overweight and have a high risk for diabetes, consider being tested at a younger age or more often. PREVENTING INFECTION  Hepatitis B  If you have a higher risk for hepatitis B, you should be screened for this virus. You are considered at high risk for hepatitis B if:  You were born in a country where hepatitis B is common. Ask your health care provider which countries are considered high risk.  Your parents were born in a high-risk country, and you have not been immunized against hepatitis B (hepatitis B vaccine).  You have HIV or AIDS.  You use needles to inject street drugs.  You live with someone who has hepatitis B.  You have had sex with someone who has hepatitis B.  You get hemodialysis treatment.  You take certain medicines for conditions, including cancer, organ transplantation, and autoimmune conditions. Hepatitis C  Blood testing is recommended for:  Everyone born from [birth year] through 1967.  Anyone with known risk factors for hepatitis C. Sexually transmitted infections (STIs)  You should be screened for sexually transmitted infections (STIs) including gonorrhea and chlamydia if:  You are sexually active and are younger than 52 years of age.  You are older than 53 years of age and your health care provider tells you that you are at risk for this type of infection.  Your sexual activity has changed since you were last screened and you are at an increased risk for chlamydia or gonorrhea. Ask your health care provider if you are at risk.  If you do not have HIV, but are at risk, it may be recommended that you take a prescription medicine daily to prevent HIV infection. This is called pre-exposure prophylaxis (PrEP). You are considered at risk if:  You are sexually active and do not regularly use condoms or know the HIV status of your partner(s).  You take drugs by injection.  You are sexually  active with a partner who has HIV. Talk with your health care provider about whether you are at high risk of being infected with HIV. If you choose to begin PrEP, you should first be tested for HIV. You should then be tested every 3 months for as long as you are taking PrEP.  PREGNANCY   If you are premenopausal and you may become pregnant, ask your health care provider about preconception counseling.  If you may  become pregnant, take 400 to 800 micrograms (mcg) of folic acid every day.  If you want to prevent pregnancy, talk to your health care provider about birth control (contraception). OSTEOPOROSIS AND MENOPAUSE   Osteoporosis is a disease in which the bones lose minerals and strength with aging. This can result in serious bone fractures. Your risk for osteoporosis can be identified using a bone density scan.  If you are 61 years of age or older, or if you are at risk for osteoporosis and fractures, ask your health care provider if you should be screened.  Ask your health care provider whether you should take a calcium or vitamin D supplement to lower your risk for osteoporosis.  Menopause may have certain physical symptoms and risks.  Hormone replacement therapy may reduce some of these symptoms and risks. Talk to your health care provider about whether hormone replacement therapy is right for you.  HOME CARE INSTRUCTIONS   Schedule regular health, dental, and eye exams.  Stay current with your immunizations.   Do not use any tobacco products including cigarettes, chewing tobacco, or electronic cigarettes.  If you are pregnant, do not drink alcohol.  If you are breastfeeding, limit how much and how often you drink alcohol.  Limit alcohol intake to no more than 1 drink per day for nonpregnant women. One drink equals 12 ounces of beer, 5 ounces of wine, or 1 ounces of hard liquor.  Do not use street drugs.  Do not share needles.  Ask your health care provider for help if  you need support or information about quitting drugs.  Tell your health care provider if you often feel depressed.  Tell your health care provider if you have ever been abused or do not feel safe at home.   This information is not intended to replace advice given to you by your health care provider. Make sure you discuss any questions you have with your health care provider.   Document Released: 11/14/2010 Document Revised: 05/22/2014 Document Reviewed: 04/02/2013 Elsevier Interactive Patient Education [employer].

## 2015-12-06 LAB — PAP IG W/ RFLX HPV ASCU: PAP Smear Comment: 0

## 2015-12-06 MED FILL — UNIFINE PENTIPS 31GX3/16: 31G X 5 MM | 90 days supply | Qty: 100 | Fill #0

## 2015-12-06 MED FILL — SAXENDA 18 MG/3 ML PEN: 18 | 30 days supply | Qty: 15 | Fill #0

## 2015-12-07 ENCOUNTER — Telehealth: Payer: Self-pay

## 2015-12-07 ENCOUNTER — Other Ambulatory Visit: Payer: 59

## 2015-12-07 DIAGNOSIS — Z01419 Encounter for gynecological examination (general) (routine) without abnormal findings: Secondary | ICD-10-CM | POA: Diagnosis not present

## 2015-12-07 DIAGNOSIS — Z0000 Encounter for general adult medical examination without abnormal findings: Secondary | ICD-10-CM | POA: Diagnosis not present

## 2015-12-07 DIAGNOSIS — E785 Hyperlipidemia, unspecified: Secondary | ICD-10-CM | POA: Diagnosis not present

## 2015-12-07 DIAGNOSIS — E669 Obesity, unspecified: Secondary | ICD-10-CM | POA: Diagnosis not present

## 2015-12-07 DIAGNOSIS — E8881 Metabolic syndrome: Secondary | ICD-10-CM | POA: Diagnosis not present

## 2015-12-07 DIAGNOSIS — F4323 Adjustment disorder with mixed anxiety and depressed mood: Secondary | ICD-10-CM | POA: Diagnosis not present

## 2015-12-08 ENCOUNTER — Other Ambulatory Visit: Payer: Self-pay | Admitting: Family

## 2015-12-08 LAB — CMP14+EGFR
ALT: 24 IU/L (ref 0–32)
AST: 16 IU/L (ref 0–40)
Albumin/Globulin Ratio: 1.5 (ref 1.2–2.2)
Albumin: 4 g/dL (ref 3.5–5.5)
Alkaline Phosphatase: 73 IU/L (ref 39–117)
BUN/Creatinine Ratio: 19 (ref 9–23)
BUN: 15 mg/dL (ref 6–24)
Bilirubin Total: 0.4 mg/dL (ref 0.0–1.2)
CO2: 25 mmol/L (ref 18–29)
Calcium: 9 mg/dL (ref 8.7–10.2)
Chloride: 102 mmol/L (ref 96–106)
Creatinine, Ser: 0.78 mg/dL (ref 0.57–1.00)
GFR calc Af Amer: 101 mL/min/{1.73_m2} (ref >59)
GFR calc non Af Amer: 88 mL/min/{1.73_m2} (ref >59)
Globulin, Total: 2.7 g/dL (ref 1.5–4.5)
Glucose: 93 mg/dL (ref 65–99)
Potassium: 5 mmol/L (ref 3.5–5.2)
Sodium: 142 mmol/L (ref 134–144)
Total Protein: 6.7 g/dL (ref 6.0–8.5)

## 2015-12-08 LAB — ANEMIA PROFILE B
Basophils Absolute: 0 10*3/uL (ref 0.0–0.2)
Basos: 1 %
EOS (ABSOLUTE): 0.1 10*3/uL (ref 0.0–0.4)
Eos: 2 %
Ferritin: 169 ng/mL — ABNORMAL HIGH (ref 15–150)
Folate: 11.2 ng/mL (ref >3.0)
Hematocrit: 42.6 % (ref 34.0–46.6)
Hemoglobin: 14.1 g/dL (ref 11.1–15.9)
Immature Grans (Abs): 0 10*3/uL (ref 0.0–0.1)
Immature Granulocytes: 0 %
Iron Saturation: 38 % (ref 15–55)
Iron: 116 ug/dL (ref 27–159)
Lymphocytes Absolute: 1.7 10*3/uL (ref 0.7–3.1)
Lymphs: 33 %
MCH: 29.1 pg (ref 26.6–33.0)
MCHC: 33.1 g/dL (ref 31.5–35.7)
MCV: 88 fL (ref 79–97)
Monocytes Absolute: 0.4 10*3/uL (ref 0.1–0.9)
Monocytes: 7 %
Neutrophils Absolute: 2.9 10*3/uL (ref 1.4–7.0)
Neutrophils: 57 %
Platelets: 248 10*3/uL (ref 150–379)
RBC: 4.84 x10E6/uL (ref 3.77–5.28)
RDW: 14.1 % (ref 12.3–15.4)
Retic Ct Pct: 1.3 % (ref 0.6–2.6)
Total Iron Binding Capacity: 302 ug/dL (ref 250–450)
UIBC: 186 ug/dL (ref 131–425)
Vitamin B-12: 313 pg/mL (ref 211–946)
WBC: 5.2 10*3/uL (ref 3.4–10.8)

## 2015-12-08 LAB — LIPID PANEL
Chol/HDL Ratio: 3.3 ratio units (ref 0.0–4.4)
Cholesterol, Total: 167 mg/dL (ref 100–199)
HDL: 51 mg/dL (ref >39)
LDL Calculated: 100 mg/dL — ABNORMAL HIGH (ref 0–99)
Triglycerides: 82 mg/dL (ref 0–149)
VLDL Cholesterol Cal: 16 mg/dL (ref 5–40)

## 2015-12-08 LAB — THYROID PANEL WITH TSH
Free Thyroxine Index: 1.6 (ref 1.2–4.9)
T3 Uptake Ratio: 28 % (ref 24–39)
T4, Total: 5.7 ug/dL (ref 4.5–12.0)
TSH: 4.4 u[IU]/mL (ref 0.450–4.500)

## 2015-12-08 LAB — VITAMIN D 25 HYDROXY (VIT D DEFICIENCY, FRACTURES): Vit D, 25-Hydroxy: 29.3 ng/mL — ABNORMAL LOW (ref 30.0–100.0)

## 2015-12-08 MED ORDER — VITAMIN D (ERGOCALCIFEROL) 1.25 MG (50000 UNIT) PO CAPS: 50000.0000 [IU] | ORAL_CAPSULE | ORAL | 3 refills | Status: DC

## 2015-12-08 MED FILL — VIT D2 1.25 MG (50,000 UNIT: 1.25 MG | 84 days supply | Qty: 12 | Fill #0

## 2015-12-14 NOTE — Telephone Encounter (Signed)
done

## 2016-02-11 ENCOUNTER — Other Ambulatory Visit: Payer: Self-pay | Admitting: Family

## 2016-02-15 MED FILL — SAXENDA 18 MG/3 ML PEN: 18 | 30 days supply | Qty: 15 | Fill #1

## 2016-04-03 ENCOUNTER — Encounter: Payer: 59 | Admitting: *Deleted

## 2016-04-03 DIAGNOSIS — Z1231 Encounter for screening mammogram for malignant neoplasm of breast: Secondary | ICD-10-CM | POA: Diagnosis not present

## 2016-05-17 ENCOUNTER — Other Ambulatory Visit: Payer: Self-pay | Admitting: Family

## 2016-05-17 DIAGNOSIS — E669 Obesity, unspecified: Secondary | ICD-10-CM

## 2016-05-17 DIAGNOSIS — E8881 Metabolic syndrome: Secondary | ICD-10-CM

## 2016-05-18 MED FILL — SAXENDA 18 MG/3 ML PEN: 18 | 30 days supply | Qty: 15 | Fill #0

## 2016-05-30 IMAGING — CR DG ABDOMEN 1V
1 series · 1 of 1 positions shown · non-contrast
Comparison: None.

CLINICAL DATA: Right flank pain

EXAM:
ABDOMEN - 1 VIEW

[view not recorded]
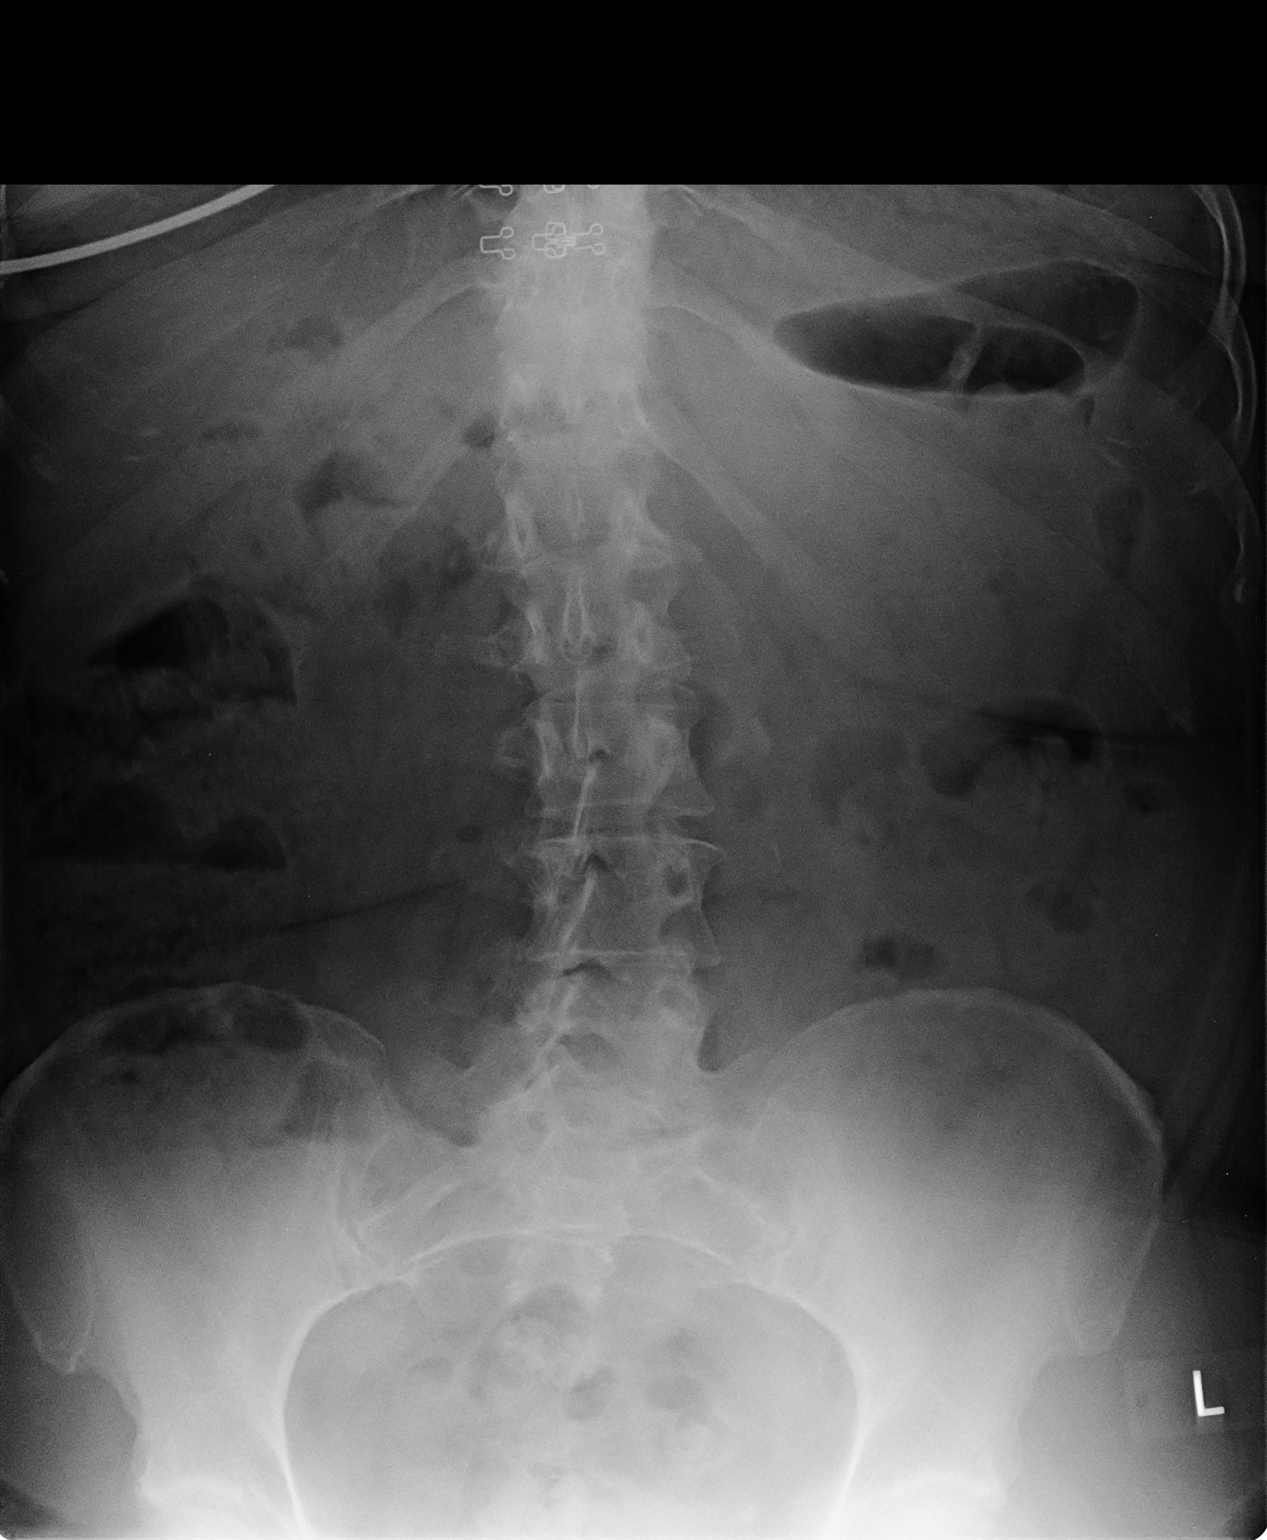

[1 of 1 positions shown; findings below may reference images not displayed]

FINDINGS: Mild levoscoliosis. Moderate fecal retention throughout the large
bowel. No definite abnormal opacities.
IMPRESSION: No acute findings

## 2016-06-12 ENCOUNTER — Other Ambulatory Visit: Payer: Self-pay | Admitting: Family

## 2016-06-12 MED ORDER — VILAZODONE HCL 40 MG PO TABS: ORAL_TABLET | ORAL | 1 refills | Status: DC

## 2016-06-19 ENCOUNTER — Other Ambulatory Visit: Payer: Self-pay | Admitting: Family

## 2016-06-19 MED ORDER — CIPROFLOXACIN HCL 500 MG PO TABS: 500.0000 mg | ORAL_TABLET | Freq: Two times a day (BID) | ORAL | 0 refills | Status: DC

## 2016-06-19 MED ORDER — SCOPOLAMINE 1 MG/3DAYS TD PT72: 1.0000 | MEDICATED_PATCH | TRANSDERMAL | 2 refills | Status: DC

## 2016-06-19 MED ORDER — OSELTAMIVIR PHOSPHATE 75 MG PO CAPS: 75.0000 mg | ORAL_CAPSULE | Freq: Two times a day (BID) | ORAL | 0 refills | Status: DC

## 2016-06-19 MED FILL — VIIBRYD 40 MG TABLET: 40 | 90 days supply | Qty: 90 | Fill #0 | Status: TO

## 2016-06-19 NOTE — Progress Notes (Signed)
[place] and requesting Tamiflu, cipro, and scope patches be sent into pharmacy. Pt has hx of recurrent UTI's.

## 2016-08-10 MED FILL — SAXENDA 18 MG/3 ML PEN: 18 | 30 days supply | Qty: 15 | Fill #1

## 2016-10-03 ENCOUNTER — Ambulatory Visit (INDEPENDENT_AMBULATORY_CARE_PROVIDER_SITE_OTHER): Payer: 59

## 2016-10-03 ENCOUNTER — Other Ambulatory Visit: Payer: Self-pay | Admitting: Orthopedic Surgery

## 2016-10-03 DIAGNOSIS — R52 Pain, unspecified: Secondary | ICD-10-CM

## 2016-10-03 DIAGNOSIS — M25512 Pain in left shoulder: Secondary | ICD-10-CM | POA: Diagnosis not present

## 2016-10-05 DIAGNOSIS — G8929 Other chronic pain: Secondary | ICD-10-CM | POA: Diagnosis not present

## 2016-10-05 DIAGNOSIS — M25512 Pain in left shoulder: Secondary | ICD-10-CM | POA: Diagnosis not present

## 2016-11-01 ENCOUNTER — Telehealth: Payer: 59 | Admitting: Family

## 2016-11-01 DIAGNOSIS — B373 Candidiasis of vulva and vagina: Secondary | ICD-10-CM

## 2016-11-01 DIAGNOSIS — B3731 Acute candidiasis of vulva and vagina: Secondary | ICD-10-CM

## 2016-11-01 MED ORDER — FLUCONAZOLE 150 MG PO TABS: 150.0000 mg | ORAL_TABLET | ORAL | 0 refills | Status: DC | PRN

## 2016-11-01 NOTE — Progress Notes (Signed)

## 2017-01-01 MED FILL — VIIBRYD 40 MG TABLET: 40 | 90 days supply | Qty: 90 | Fill #0

## 2017-02-08 ENCOUNTER — Other Ambulatory Visit: Payer: Self-pay | Admitting: Family

## 2017-02-08 MED ORDER — PHENTERMINE HCL 37.5 MG PO TABS: 37.5000 mg | ORAL_TABLET | Freq: Every day | ORAL | 3 refills | Status: DC

## 2017-06-08 DIAGNOSIS — Z1231 Encounter for screening mammogram for malignant neoplasm of breast: Secondary | ICD-10-CM | POA: Diagnosis not present

## 2017-07-31 ENCOUNTER — Other Ambulatory Visit: Payer: Self-pay | Admitting: Family

## 2017-08-03 ENCOUNTER — Other Ambulatory Visit: Payer: Self-pay | Admitting: Family

## 2017-08-03 MED ORDER — VILAZODONE HCL 40 MG PO TABS: ORAL_TABLET | ORAL | 1 refills | Status: DC

## 2017-08-03 MED FILL — VIIBRYD 40 MG TABLET: 40 | 90 days supply | Qty: 90 | Fill #0

## 2017-08-06 MED FILL — SHIPPING COST: 1 days supply | Qty: 1 | Fill #0

## 2017-10-01 ENCOUNTER — Other Ambulatory Visit: Payer: Self-pay | Admitting: Family

## 2017-10-01 MED ORDER — AZITHROMYCIN 1 % OP SOLN: 1.0000 [drp] | Freq: Every day | OPHTHALMIC | 0 refills | Status: DC

## 2017-10-01 NOTE — Progress Notes (Signed)
Pt called complaining of discharge of eye and pain. States her grand daughter was diagnosed with pink eye this weekend.

## 2017-10-12 ENCOUNTER — Other Ambulatory Visit: Payer: Self-pay | Admitting: Family

## 2017-10-12 MED ORDER — NITROFURANTOIN MONOHYD MACRO 100 MG PO CAPS: 100.0000 mg | ORAL_CAPSULE | Freq: Two times a day (BID) | ORAL | 0 refills | Status: DC

## 2017-11-01 ENCOUNTER — Ambulatory Visit (INDEPENDENT_AMBULATORY_CARE_PROVIDER_SITE_OTHER): Payer: 59 | Admitting: Family

## 2017-11-01 ENCOUNTER — Encounter: Payer: Self-pay | Admitting: Family

## 2017-11-01 ENCOUNTER — Encounter: Payer: 59 | Admitting: Family

## 2017-11-01 VITALS — BP 139/89 | HR 92 | Temp 97.1°F | Ht 63.0 in | Wt 208.0 lb

## 2017-11-01 DIAGNOSIS — E8881 Metabolic syndrome: Secondary | ICD-10-CM

## 2017-11-01 DIAGNOSIS — Z0000 Encounter for general adult medical examination without abnormal findings: Secondary | ICD-10-CM | POA: Diagnosis not present

## 2017-11-01 DIAGNOSIS — E669 Obesity, unspecified: Secondary | ICD-10-CM

## 2017-11-01 DIAGNOSIS — F4323 Adjustment disorder with mixed anxiety and depressed mood: Secondary | ICD-10-CM

## 2017-11-01 DIAGNOSIS — E559 Vitamin D deficiency, unspecified: Secondary | ICD-10-CM

## 2017-11-01 DIAGNOSIS — E785 Hyperlipidemia, unspecified: Secondary | ICD-10-CM

## 2017-11-01 NOTE — Patient Instructions (Signed)

## 2017-11-01 NOTE — Progress Notes (Signed)
Subjective:    Patient ID: Bailey Valdez, female    DOB: 16-Nov-1965, 52 y.o.   MRN: [id number]  Chief Complaint  Patient presents with  . Annual Exam   PT presents to the office today for CPE without pap.  Hyperlipidemia  This is a chronic problem. The current episode started more than 1 year ago. The problem is uncontrolled. Recent lipid tests were reviewed and are high. Exacerbating diseases include obesity. Current antihyperlipidemic treatment includes diet change. The current treatment provides mild improvement of lipids. Risk factors for coronary artery disease include dyslipidemia, obesity and a sedentary lifestyle.  Depression         This is a chronic problem.  The current episode started more than 1 year ago.   The onset quality is gradual.   The problem occurs intermittently.  The problem has been waxing and waning since onset.  Associated symptoms include irritable.  Associated symptoms include no helplessness, no hopelessness, no restlessness, no decreased interest and not sad.  Past treatments include SNRIs - Serotonin and norepinephrine reuptake inhibitors.  Previous treatment provided moderate relief. Metabolic Syndrome PT states over the last few months she has been dieting and trying to become more active. She reports she has lost     Review of Systems  Psychiatric/Behavioral: Positive for depression.  All other systems reviewed and are negative.  Breast Cancer-relatedfamily history is not on file.  Social History   Socioeconomic History  . Marital status: Married    Spouse name: Not on file  . Number of children: Not on file  . Years of education: Not on file  . Highest education level: Not on file  Occupational History  . Not on file  Social Needs  . Financial resource strain: Not on file  . Food insecurity:    Worry: Not on file    Inability: Not on file  . Transportation needs:    Medical: Not on file    Non-medical: Not on file  Tobacco Use  . Smoking  status: Never Smoker  . Smokeless tobacco: Never Used  Substance and Sexual Activity  . Alcohol use: No    Alcohol/week: 0.0 oz  . Drug use: No  . Sexual activity: Not on file  Lifestyle  . Physical activity:    Days per week: Not on file    Minutes per session: Not on file  . Stress: Not on file  Relationships  . Social connections:    Talks on phone: Not on file    Gets together: Not on file    Attends religious service: Not on file    Active member of club or organization: Not on file    Attends meetings of clubs or organizations: Not on file    Relationship status: Not on file  Other Topics Concern  . Not on file  Social History Narrative  . Not on file       Objective:   Physical Exam  Constitutional: She is oriented to person, place, and time. She appears well-developed and well-nourished. She is irritable. No distress.  HENT:  Head: Normocephalic and atraumatic.  Right Ear: External ear normal.  Left Ear: External ear normal.  Nose: Nose normal.  Mouth/Throat: Oropharynx is clear and moist.  Eyes: Pupils are equal, round, and reactive to light.  Neck: Normal range of motion. Neck supple. No thyromegaly present.  Cardiovascular: Normal rate, regular rhythm, normal heart sounds and intact distal pulses.  No murmur heard. Pulmonary/Chest: Effort normal  and breath sounds normal. No respiratory distress.  Abdominal: Soft. Bowel sounds are normal. She exhibits no distension. There is no tenderness.  Musculoskeletal: Normal range of motion. She exhibits no edema or tenderness.  Neurological: She is alert and oriented to person, place, and time. She has normal reflexes. No cranial nerve deficit.  Skin: Skin is warm and dry.  Psychiatric: She has a normal mood and affect. Her behavior is normal. Judgment and thought content normal.  Vitals reviewed.     BP 139/89   Pulse 92   Temp (!) 97.1 F (36.2 C) (Oral)   Ht 5' 3"  (1.6 m)   Wt 208 lb (94.3 kg)   BMI 36.85  kg/m      Assessment & Plan:  Bailey Valdez comes in today with chief complaint of Annual Exam   Diagnosis and orders addressed:  1. Annual physical exam - CMP14+EGFR - CBC with Differential/Platelet - Lipid panel - VITAMIN D 25 Hydroxy (Vit-D Deficiency, Fractures) - TSH  2. Adjustment disorder with mixed anxiety and depressed mood - CMP14+EGFR - CBC with Differential/Platelet  3. Hyperlipidemia, unspecified hyperlipidemia type - CMP14+EGFR - CBC with Differential/Platelet - Lipid panel  4. Obesity (BMI 30-39.9) - CMP14+EGFR - CBC with Differential/Platelet  5. Metabolic syndrome - CNO70+JGGE - CBC with Differential/Platelet - Lipid panel  6. Vitamin D deficiency - CMP14+EGFR - CBC with Differential/Platelet - VITAMIN D 25 Hydroxy (Vit-D Deficiency, Fractures)   Labs pending Health Maintenance reviewed Diet and exercise encouraged  Follow up plan: 1 year   Evelina Dun, FNP

## 2018-01-29 MED FILL — VIIBRYD 40 MG TABLET: 40 | 90 days supply | Qty: 90 | Fill #1

## 2018-01-29 MED FILL — SHIPPING COST: 1 days supply | Qty: 1 | Fill #1

## 2018-05-30 ENCOUNTER — Ambulatory Visit (INDEPENDENT_AMBULATORY_CARE_PROVIDER_SITE_OTHER): Payer: 59 | Admitting: Family

## 2018-05-30 DIAGNOSIS — R0602 Shortness of breath: Secondary | ICD-10-CM

## 2018-05-30 DIAGNOSIS — R079 Chest pain, unspecified: Secondary | ICD-10-CM

## 2018-06-03 ENCOUNTER — Ambulatory Visit (INDEPENDENT_AMBULATORY_CARE_PROVIDER_SITE_OTHER): Payer: 59

## 2018-06-03 ENCOUNTER — Ambulatory Visit (INDEPENDENT_AMBULATORY_CARE_PROVIDER_SITE_OTHER): Payer: 59 | Admitting: Family

## 2018-06-03 ENCOUNTER — Encounter: Payer: Self-pay | Admitting: Family

## 2018-06-03 DIAGNOSIS — E669 Obesity, unspecified: Secondary | ICD-10-CM | POA: Diagnosis not present

## 2018-06-03 DIAGNOSIS — R0602 Shortness of breath: Secondary | ICD-10-CM | POA: Diagnosis not present

## 2018-06-03 DIAGNOSIS — F4323 Adjustment disorder with mixed anxiety and depressed mood: Secondary | ICD-10-CM

## 2018-06-03 DIAGNOSIS — E8881 Metabolic syndrome: Secondary | ICD-10-CM

## 2018-06-03 DIAGNOSIS — Z0000 Encounter for general adult medical examination without abnormal findings: Secondary | ICD-10-CM

## 2018-06-03 DIAGNOSIS — Z0001 Encounter for general adult medical examination with abnormal findings: Secondary | ICD-10-CM

## 2018-06-03 DIAGNOSIS — E785 Hyperlipidemia, unspecified: Secondary | ICD-10-CM | POA: Diagnosis not present

## 2018-06-03 DIAGNOSIS — E559 Vitamin D deficiency, unspecified: Secondary | ICD-10-CM | POA: Diagnosis not present

## 2018-06-03 NOTE — Patient Instructions (Signed)
Shortness of Breath, Adult  Shortness of breath is when a person has trouble breathing enough air or when a person feels like she or he is having trouble breathing in enough air. Shortness of breath could be a sign of a medical problem.  Follow these instructions at home:     Pay attention to any changes in your symptoms.   Do not use any products that contain nicotine or tobacco, such as cigarettes, e-cigarettes, and chewing tobacco.   Do not smoke. Smoking is a common cause of shortness of breath. If you need help quitting, ask your health care provider.   Avoid things that can irritate your airways, such as:  ? Mold.  ? Dust.  ? Air pollution.  ? Chemical fumes.  ? Things that can cause allergy symptoms (allergens), if you have allergies.   Keep your living space clean and free of mold and dust.   Rest as needed. Slowly return to your usual activities.   Take over-the-counter and prescription medicines only as told by your health care provider. This includes oxygen therapy and inhaled medicines.   Keep all follow-up visits as told by your health care provider. This is important.  Contact a health care provider if:   Your condition does not improve as soon as expected.   You have a hard time doing your normal activities, even after you rest.   You have new symptoms.  Get help right away if:   Your shortness of breath gets worse.   You have shortness of breath when you are resting.   You feel light-headed or you faint.   You have a cough that is not controlled with medicines.   You cough up blood.   You have pain with breathing.   You have pain in your chest, arms, shoulders, or abdomen.   You have a fever.   You cannot walk up stairs or exercise the way that you normally do.  These symptoms may represent a serious problem that is an emergency. Do not wait to see if the symptoms will go away. Get medical help right away. Call your local emergency services (911 in the [place] Do not drive yourself  to the hospital.  Summary   Shortness of breath is when a person has trouble breathing enough air. It can be a sign of a medical problem.   Avoid things that irritate your lungs, such as smoking, pollution, mold, and dust.   Pay attention to changes in your symptoms and contact your health care provider if you have a hard time completing daily activities because of shortness of breath.  This information is not intended to replace advice given to you by your health care provider. Make sure you discuss any questions you have with your health care provider.  Document Released: 01/24/2001 Document Revised: 10/01/2017 Document Reviewed: 10/01/2017  Elsevier Interactive Patient Education  2019 [employer]

## 2018-06-03 NOTE — Progress Notes (Signed)
Subjective:    Patient ID: Bailey Valdez, female    DOB: 06/28/1965, 53 y.o.   MRN: [id number]  Chief Complaint  Patient presents with  . Shortness of Breath   Pt presents to the office today for CPE and complaints of SOB. She states she has noticed the SOB over the last 6-7 months and it has gradually become worse. She reports she had chest pain with sex about 4 months ago that scared her. She is scheduled to see the Cardiologists this week.  Shortness of Breath  This is a new problem. The current episode started more than 1 month ago (6-7 months that is worsening). The problem has been gradually worsening. Associated symptoms include chest pain (about 4 months ago during sex) and orthopnea. Pertinent negatives include no fever, leg swelling, rhinorrhea or sore throat.  Depression         This is a chronic problem.  The current episode started more than 1 year ago.   The onset quality is gradual.   The problem occurs intermittently.  Associated symptoms include irritable and sad.  Associated symptoms include no helplessness and no hopelessness.  Past treatments include SNRIs - Serotonin and norepinephrine reuptake inhibitors. Hyperlipidemia  This is a chronic problem. The current episode started more than 1 year ago. The problem is controlled. Recent lipid tests were reviewed and are high. Exacerbating diseases include obesity. Associated symptoms include chest pain (about 4 months ago during sex) and shortness of breath. The current treatment provides mild improvement of lipids. Risk factors for coronary artery disease include dyslipidemia and hypertension.  Metabolic Syndrome PT does not physical do any physical exercise and   Review of Systems  Constitutional: Negative for fever.  HENT: Negative for rhinorrhea and sore throat.   Respiratory: Positive for shortness of breath.   Cardiovascular: Positive for chest pain (about 4 months ago during sex) and orthopnea. Negative for leg swelling.   Psychiatric/Behavioral: Positive for depression.  All other systems reviewed and are negative.      Objective:   Physical Exam Vitals signs reviewed.  Constitutional:      General: She is irritable. She is not in acute distress.    Appearance: She is well-developed.  HENT:     Head: Normocephalic and atraumatic.     Right Ear: External ear normal.  Eyes:     Pupils: Pupils are equal, round, and reactive to light.  Neck:     Musculoskeletal: Normal range of motion and neck supple.     Thyroid: No thyromegaly.  Cardiovascular:     Rate and Rhythm: Normal rate and regular rhythm.     Heart sounds: Normal heart sounds. No murmur.  Pulmonary:     Effort: Pulmonary effort is normal. No respiratory distress.     Breath sounds: Normal breath sounds. No wheezing.  Abdominal:     General: Bowel sounds are normal. There is no distension.     Palpations: Abdomen is soft.     Tenderness: There is no abdominal tenderness.  Musculoskeletal: Normal range of motion.        General: No tenderness.  Skin:    General: Skin is warm and dry.  Neurological:     Mental Status: She is alert and oriented to person, place, and time.     Cranial Nerves: No cranial nerve deficit.     Deep Tendon Reflexes: Reflexes are normal and symmetric.  Psychiatric:        Behavior: Behavior normal.  Thought Content: Thought content normal.        Judgment: Judgment normal.       BP 138/85   Pulse 96   Temp (!) 97.1 F (36.2 C) (Oral)   Ht 5\' 2"  (1.575 m)   Wt 213 lb (96.6 kg)   BMI 38.96 kg/m      Assessment & Plan:  Bailey Valdez comes in today with chief complaint of Shortness of Breath   Diagnosis and orders addressed:  1. Annual physical exam  2. Hyperlipidemia, unspecified hyperlipidemia type  3. Adjustment disorder with mixed anxiety and depressed mood  4. Metabolic syndrome  5. Obesity (BMI 30-39.9) - EKG 12-Lead  6. SOB (shortness of breath) - EKG 12-Lead   Labs  pending Health Maintenance reviewed Diet and exercise encouraged  Follow up plan: 1 year   Jannifer Rodney, FNP

## 2018-06-04 ENCOUNTER — Ambulatory Visit: Payer: 59 | Admitting: Cardiology

## 2018-06-04 LAB — CMP14+EGFR
ALT: 34 IU/L — ABNORMAL HIGH (ref 0–32)
AST: 31 IU/L (ref 0–40)
Albumin/Globulin Ratio: 1.4 (ref 1.2–2.2)
Albumin: 4 g/dL (ref 3.8–4.9)
Alkaline Phosphatase: 73 IU/L (ref 39–117)
BUN/Creatinine Ratio: 14 (ref 9–23)
BUN: 11 mg/dL (ref 6–24)
Bilirubin Total: 0.3 mg/dL (ref 0.0–1.2)
CO2: 24 mmol/L (ref 20–29)
Calcium: 9.1 mg/dL (ref 8.7–10.2)
Chloride: 103 mmol/L (ref 96–106)
Creatinine, Ser: 0.78 mg/dL (ref 0.57–1.00)
GFR calc Af Amer: 100 mL/min/{1.73_m2} (ref >59)
GFR calc non Af Amer: 86 mL/min/{1.73_m2} (ref >59)
Globulin, Total: 2.8 g/dL (ref 1.5–4.5)
Glucose: 92 mg/dL (ref 65–99)
Potassium: 4 mmol/L (ref 3.5–5.2)
Sodium: 144 mmol/L (ref 134–144)
Total Protein: 6.8 g/dL (ref 6.0–8.5)

## 2018-06-04 LAB — CBC WITH DIFFERENTIAL/PLATELET
Basophils Absolute: 0 10*3/uL (ref 0.0–0.2)
Basos: 1 %
EOS (ABSOLUTE): 0.1 10*3/uL (ref 0.0–0.4)
Eos: 4 %
Hematocrit: 44.4 % (ref 34.0–46.6)
Hemoglobin: 14.6 g/dL (ref 11.1–15.9)
Immature Grans (Abs): 0 10*3/uL (ref 0.0–0.1)
Immature Granulocytes: 0 %
Lymphocytes Absolute: 1.7 10*3/uL (ref 0.7–3.1)
Lymphs: 43 %
MCH: 28.9 pg (ref 26.6–33.0)
MCHC: 32.9 g/dL (ref 31.5–35.7)
MCV: 88 fL (ref 79–97)
Monocytes Absolute: 0.4 10*3/uL (ref 0.1–0.9)
Monocytes: 9 %
Neutrophils Absolute: 1.7 10*3/uL (ref 1.4–7.0)
Neutrophils: 43 %
Platelets: 247 10*3/uL (ref 150–450)
RBC: 5.05 x10E6/uL (ref 3.77–5.28)
RDW: 13 % (ref 11.7–15.4)
WBC: 3.9 10*3/uL (ref 3.4–10.8)

## 2018-06-04 LAB — TSH: TSH: 3 u[IU]/mL (ref 0.450–4.500)

## 2018-06-04 LAB — LIPID PANEL
Chol/HDL Ratio: 4.7 ratio — ABNORMAL HIGH (ref 0.0–4.4)
Cholesterol, Total: 156 mg/dL (ref 100–199)
HDL: 33 mg/dL — ABNORMAL LOW (ref >39)
LDL Calculated: 100 mg/dL — ABNORMAL HIGH (ref 0–99)
Triglycerides: 116 mg/dL (ref 0–149)
VLDL Cholesterol Cal: 23 mg/dL (ref 5–40)

## 2018-06-05 ENCOUNTER — Encounter: Payer: Self-pay | Admitting: Cardiology

## 2018-06-05 ENCOUNTER — Ambulatory Visit: Payer: 59 | Admitting: Cardiology

## 2018-06-05 VITALS — BP 124/78 | HR 92 | Ht 62.5 in | Wt 212.0 lb

## 2018-06-05 DIAGNOSIS — R0602 Shortness of breath: Secondary | ICD-10-CM | POA: Diagnosis not present

## 2018-06-05 NOTE — Patient Instructions (Signed)
Medication Instructions:  Your physician recommends that you continue on your current medications as directed. Please refer to the Current Medication list given to you today.  If you need a refill on your cardiac medications before your next appointment, please call your pharmacy.   Lab work: NONE  If you have labs (blood work) drawn today and your tests are completely normal, you will receive your results only by: Marland Kitchen MyChart Message (if you have MyChart) OR . A paper copy in the mail If you have any lab test that is abnormal or we need to change your treatment, we will call you to review the results.  Testing/Procedures: Your physician has requested that you have an echocardiogram. Echocardiography is a painless test that uses sound waves to create images of your heart. It provides your doctor with information about the size and shape of your heart and how well your heart's chambers and valves are working. This procedure takes approximately one hour. There are no restrictions for this procedure.    Follow-Up: At [facility], you and your health needs are our priority.  As part of our continuing mission to provide you with exceptional heart care, we have created designated Provider Care Teams.  These Care Teams include your primary Cardiologist (physician) and Advanced Practice Providers (APPs -  Physician Assistants and Nurse Practitioners) who all work together to provide you with the care you need, when you need it. You will need a follow up appointment pending test results.  Please call our office 2 months in advance to schedule this appointment.  You may see Dina Rich, MD or one of the following Advanced Practice Providers on your designated Care Team:   Randall An, PA-C [facility]) . Jacolyn Reedy, PA-C [facility])  Any Other Special Instructions Will Be Listed Below (If Applicable). Thank you for choosing [facility]!

## 2018-06-05 NOTE — Progress Notes (Signed)
Clinical Summary [name] is a 53 y.o.female seen as new consult, [facility].   1. SOB - symptoms started about 1 year. Mainly with activity, though can occur with laying down sometimes.  - no recent edema. No coughing or wheezing over the duration of symptoms, some over the last few days due to recent cold.  - isolated episode of chest pain 6 months ago during intercourse.No recurrent pain.  - notes some mildly elevated heart rates with limited activity.  - recent CXR without acute process - EKG SR without acute ischemic changes. - sedentary lifestyle.  - mild wheezing at night when laying down.  CAD risk factors: father with CABG in mid 1450s.       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 Has 55 year old  grandson.  No past medical history on file.   Allergies  Allergen Reactions  . Penicillins     Causes anaphylaxis  . Sulfa Antibiotics     anaphylaxis     Current Outpatient Medications  Medication Sig Dispense Refill  . Vilazodone HCl (VIIBRYD) 40 MG TABS Take 0.5-1 tab a daily 90 tablet 1   No current facility-administered medications for this visit.      Past Surgical History:  Procedure Laterality Date  . ABDOMINOPLASTY     1994     Allergies  Allergen Reactions  . Penicillins     Causes anaphylaxis  . Sulfa Antibiotics     anaphylaxis      Family History  Problem Relation Age of Onset  . Crohn's disease Mother   . Heart disease Father   . Colon cancer Maternal Uncle   . Crohn's disease Maternal Uncle      Social History [name] reports that she has never smoked. She has never used smokeless tobacco. [name] reports no history of alcohol use.   Review of Systems CONSTITUTIONAL: No weight loss, fever, chills, weakness or fatigue.  HEENT: Eyes: No visual loss, blurred vision, double vision or yellow sclerae.No hearing loss, sneezing, congestion, runny nose or sore throat.  SKIN: No rash or itching.  CARDIOVASCULAR:  per hpi RESPIRATORY: per hpi GASTROINTESTINAL: No anorexia, nausea, vomiting or diarrhea. No abdominal pain or blood.  GENITOURINARY: No burning on urination, no polyuria NEUROLOGICAL: No headache, dizziness, syncope, paralysis, ataxia, numbness or tingling in the extremities. No change in bowel or bladder control.  MUSCULOSKELETAL: No muscle, back pain, joint pain or stiffness.  LYMPHATICS: No enlarged nodes. No history of splenectomy.  PSYCHIATRIC: No history of depression or anxiety.  ENDOCRINOLOGIC: No reports of sweating, cold or heat intolerance. No polyuria or polydipsia.  Marland Kitchen.   Physical Examination Vitals:   06/05/18 1346  BP: 124/78  Pulse: 92  SpO2: 98%   Vitals:   06/05/18 1346  Weight: 212 lb (96.2 kg)  Height: 5' 2.5" (1.588 m)    Gen: resting comfortably, no acute distress HEENT: no scleral icterus, pupils equal round and reactive, no palptable cervical adenopathy,  CV: RRR, no m/r/g, no jvd Resp: Clear to auscultation bilaterally GI: abdomen is soft, non-tender, non-distended, normal bowel sounds, no hepatosplenomegaly MSK: extremities are warm, no edema.  Skin: warm, no rash Neuro:  no focal deficits Psych: appropriate affect    Assessment and Plan  1. SOB/DOE - unclear etiology - from cardiac standpoint we will plan for an echo to evaluate for underlying structural heart disease or dysfunction - pending results may consider GXT  Arnoldo Lenis, M.D.

## 2018-06-14 ENCOUNTER — Ambulatory Visit (HOSPITAL_COMMUNITY)
Admission: RE | Admit: 2018-06-14 | Discharge: 2018-06-14 | Disposition: A | Discharge status: Home / Self Care | Payer: 59 | Source: Ambulatory Visit | Attending: Cardiology | Admitting: Cardiology

## 2018-06-14 DIAGNOSIS — R0602 Shortness of breath: Secondary | ICD-10-CM | POA: Diagnosis not present

## 2018-06-14 NOTE — Progress Notes (Signed)
*  PRELIMINARY RESULTS* Echocardiogram 2D Echocardiogram has been performed.  Stacey Drain 06/14/2018, 9:17 AM

## 2018-09-06 ENCOUNTER — Other Ambulatory Visit: Payer: Self-pay | Admitting: Family

## 2018-09-12 MED FILL — VIIBRYD 40 MG TABLET: 40 | 90 days supply | Qty: 90 | Fill #0

## 2018-11-12 ENCOUNTER — Encounter: Payer: Self-pay | Admitting: Family Medicine

## 2018-11-12 ENCOUNTER — Ambulatory Visit (INDEPENDENT_AMBULATORY_CARE_PROVIDER_SITE_OTHER): Payer: 59 | Admitting: Family Medicine

## 2018-11-12 DIAGNOSIS — R350 Frequency of micturition: Secondary | ICD-10-CM | POA: Diagnosis not present

## 2018-11-12 DIAGNOSIS — R300 Dysuria: Secondary | ICD-10-CM

## 2018-11-12 DIAGNOSIS — R319 Hematuria, unspecified: Secondary | ICD-10-CM | POA: Diagnosis not present

## 2018-11-12 DIAGNOSIS — R3915 Urgency of urination: Secondary | ICD-10-CM | POA: Diagnosis not present

## 2018-11-12 DIAGNOSIS — Z8619 Personal history of other infectious and parasitic diseases: Secondary | ICD-10-CM | POA: Diagnosis not present

## 2018-11-12 MED ORDER — CEPHALEXIN 500 MG PO CAPS: 500.0000 mg | ORAL_CAPSULE | Freq: Three times a day (TID) | ORAL | 0 refills | Status: AC

## 2018-11-12 MED ORDER — PHENAZOPYRIDINE HCL 100 MG PO TABS: 100.0000 mg | ORAL_TABLET | Freq: Three times a day (TID) | ORAL | 0 refills | Status: AC | PRN

## 2018-11-12 MED ORDER — FLUCONAZOLE 150 MG PO TABS: 150.0000 mg | ORAL_TABLET | Freq: Once | ORAL | 0 refills | Status: AC

## 2018-11-12 NOTE — Progress Notes (Signed)
Virtual Visit via telephone Note Due to COVID-19, visit is conducted virtually and was requested by patient. This visit type was conducted due to national recommendations for restrictions regarding the COVID-19 Pandemic (e.g. social distancing) in an effort to limit this patient's exposure and mitigate transmission in our community. All issues noted in this document were discussed and addressed.  A physical exam was not performed with this format.   I connected with Bailey Valdez on 11/12/18 at [place] by telephone and verified that I am speaking with the correct person using two identifiers. Bailey Valdez is currently located at home and family is currently with them during visit. The provider, Monia Pouch, FNP is located in their office at time of visit.  I discussed the limitations, risks, security and privacy concerns of performing an evaluation and management service by telephone and the availability of in person appointments. I also discussed with the patient that there may be a patient responsible charge related to this service. The patient expressed understanding and agreed to proceed.  Subjective:  Patient ID: Bailey Valdez, female    DOB: 01/10/1966, 53 y.o.   MRN: [id number]  Chief Complaint:  Urinary Tract Infection   HPI: Bailey Valdez is a 53 y.o. female presenting on 11/12/2018 for Urinary Tract Infection   Pt reports dysuria, frequency, and urgency that started Saturday. She increased water intake without relief of symptoms. States this morning she has slight hematuria. No flank pain. Lower abdominal pressure with voiding. No fever, chills, weakness, or confusion.   Urinary Tract Infection  This is a new problem. The current episode started in the past 7 days. The problem occurs every urination. The problem has been gradually worsening. The quality of the pain is described as burning and aching. The pain is at a severity of 4/10. The pain is moderate. There has been no fever. She  is sexually active. There is no history of pyelonephritis. Associated symptoms include frequency, hematuria and urgency. Pertinent negatives include no chills, discharge, flank pain, hesitancy, nausea, possible pregnancy, sweats or vomiting. She has tried increased fluids for the symptoms. The treatment provided no relief.     Relevant past medical, surgical, family, and social history reviewed and updated as indicated.  Allergies and medications reviewed and updated.   History reviewed. No pertinent past medical history.  Past Surgical History:  Procedure Laterality Date   ABDOMINOPLASTY     1994    Social History   Socioeconomic History   Marital status: Married    Spouse name: Not on file   Number of children: Not on file   Years of education: Not on file   Highest education level: Not on file  Occupational History   Not on file  Social Needs   Financial resource strain: Not on file   Food insecurity    Worry: Not on file    Inability: Not on file   Transportation needs    Medical: Not on file    Non-medical: Not on file  Tobacco Use   Smoking status: Never Smoker   Smokeless tobacco: Never Used  Substance and Sexual Activity   Alcohol use: No    Alcohol/week: 0.0 standard drinks   Drug use: No   Sexual activity: Not on file  Lifestyle   Physical activity    Days per week: Not on file    Minutes per session: Not on file   Stress: Not on file  Relationships   Social connections  Talks on phone: Not on file    Gets together: Not on file    Attends religious service: Not on file    Active member of club or organization: Not on file    Attends meetings of clubs or organizations: Not on file    Relationship status: Not on file   Intimate partner violence    Fear of current or ex partner: Not on file    Emotionally abused: Not on file    Physically abused: Not on file    Forced sexual activity: Not on file  Other Topics Concern   Not on  file  Social History Narrative   Not on file    Outpatient Encounter Medications as of 11/12/2018  Medication Sig   cephALEXin (KEFLEX) 500 MG capsule Take 1 capsule (500 mg total) by mouth 3 (three) times daily for 7 days.   fluconazole (DIFLUCAN) 150 MG tablet Take 1 tablet (150 mg total) by mouth once for 1 dose.   phenazopyridine (PYRIDIUM) 100 MG tablet Take 1 tablet (100 mg total) by mouth 3 (three) times daily as needed for up to 3 days for pain.   VIIBRYD 40 MG TABS TAKE 1/2-1 TABLET BY MOUTH DAILY   No facility-administered encounter medications on file as of 11/12/2018.     Allergies  Allergen Reactions   Penicillins     Causes anaphylaxis   Sulfa Antibiotics     anaphylaxis    Review of Systems  Constitutional: Negative for chills, fatigue and fever.  Respiratory: Negative for cough and shortness of breath.   Cardiovascular: Negative for chest pain and palpitations.  Gastrointestinal: Positive for abdominal pain. Negative for constipation, diarrhea, nausea and vomiting.  Genitourinary: Positive for dysuria, frequency, hematuria and urgency. Negative for decreased urine volume, difficulty urinating, dyspareunia, enuresis, flank pain, genital sores, hesitancy, pelvic pain, vaginal bleeding, vaginal discharge and vaginal pain.  Neurological: Negative for weakness and headaches.  Psychiatric/Behavioral: Negative for behavioral problems.  All other systems reviewed and are negative.        Observations/Objective: No vital signs or physical exam, this was a telephone or virtual health encounter.  Pt alert and oriented, answers all questions appropriately, and able to speak in full sentences.    Assessment and Plan: Bailey Valdez was seen today for urinary tract infection.  Diagnoses and all orders for this visit:  Dysuria Frequency of urination Urgency of urination Hematuria, unspecified type Reported symptoms consistent with acute cystitis with hematuria. Will  empirically treat with Keflex. Increase water intake. Avoid bladder irritants. Report any new or worsening symptoms. Pyridium as needed for dysuria, 3 days max.  -     cephALEXin (KEFLEX) 500 MG capsule; Take 1 capsule (500 mg total) by mouth 3 (three) times daily for 7 days. -     phenazopyridine (PYRIDIUM) 100 MG tablet; Take 1 tablet (100 mg total) by mouth 3 (three) times daily as needed for up to 3 days for pain.  History of candidiasis Pt has a history of vaginal candidiasis with antibiotic use. Will send in diflucan. Pt aware to take if she develops symptoms.  -     fluconazole (DIFLUCAN) 150 MG tablet; Take 1 tablet (150 mg total) by mouth once for 1 dose.     Follow Up Instructions: Return if symptoms worsen or fail to improve.    I discussed the assessment and treatment plan with the patient. The patient was provided an opportunity to ask questions and all were answered. The patient agreed with  the plan and demonstrated an understanding of the instructions.   The patient was advised to call back or seek an in-person evaluation if the symptoms worsen or if the condition fails to improve as anticipated.  The above assessment and management plan was discussed with the patient. The patient verbalized understanding of and has agreed to the management plan. Patient is aware to call the clinic if symptoms persist or worsen. Patient is aware when to return to the clinic for a follow-up visit. Patient educated on when it is appropriate to go to the emergency department.    I provided 10 minutes of non-face-to-face time during this encounter. The call started at 0940. The call ended at 0950. The other time was used for coordination of care.    Kari BaarsMichelle Semir Brill, FNP-C [facility] [address] [place], [place] [address] [phone]

## 2019-04-15 MED FILL — VIIBRYD 40 MG TABLET: 40 | 90 days supply | Qty: 90 | Fill #1

## 2019-12-01 ENCOUNTER — Other Ambulatory Visit: Payer: Self-pay

## 2019-12-01 ENCOUNTER — Other Ambulatory Visit: Payer: Self-pay | Admitting: Family

## 2019-12-01 MED ORDER — VIIBRYD 40 MG PO TABS: ORAL_TABLET | ORAL | 1 refills | Status: DC

## 2019-12-01 MED FILL — VIIBRYD 40 MG TABLET: 40 | 90 days supply | Qty: 90 | Fill #0

## 2019-12-11 ENCOUNTER — Other Ambulatory Visit: Payer: Self-pay | Admitting: Family

## 2019-12-11 ENCOUNTER — Ambulatory Visit (INDEPENDENT_AMBULATORY_CARE_PROVIDER_SITE_OTHER): Payer: 59 | Admitting: Family

## 2019-12-11 ENCOUNTER — Encounter: Payer: Self-pay | Admitting: Family

## 2019-12-11 VITALS — BP 128/84 | HR 67 | Temp 97.2°F | Ht 62.5 in | Wt 218.0 lb

## 2019-12-11 DIAGNOSIS — I10 Essential (primary) hypertension: Secondary | ICD-10-CM | POA: Diagnosis not present

## 2019-12-11 MED ORDER — NEBIVOLOL HCL 10 MG PO TABS: 10.0000 mg | ORAL_TABLET | Freq: Every day | ORAL | 2 refills | Status: DC

## 2019-12-11 MED FILL — BYSTOLIC 10 MG TABLET: 10 | 90 days supply | Qty: 90 | Fill #0

## 2019-12-11 NOTE — Progress Notes (Signed)
Subjective:    Patient ID: Bailey Valdez, female    DOB: [birth year], 54 y.o.   MRN: [id number]  Chief Complaint  Patient presents with  . Hypertension  . Tachycardia   Pt presents to the office with complaints of elevated BP and tachycardia. She states her BP at home was running 150's/90's and her pulse was 90-110's. She was having fatigue, SOB at times. She saw a Cardiologists last year and had an ECHO that was normal.   She started Bystolic 10 mg over the last few weeks and states her BP and heart rate has improved. She also states her fatigue, SOB has resolved. She feels the best she has in months.  Hypertension This is a chronic problem. The current episode started more than 1 year ago. The problem has been resolved since onset. The problem is controlled. Pertinent negatives include no peripheral edema or shortness of breath (has resolved now).      Review of Systems  Respiratory: Negative for shortness of breath (has resolved now).   All other systems reviewed and are negative.      Objective:   Physical Exam Vitals reviewed.  Constitutional:      General: She is not in acute distress.    Appearance: She is well-developed.  HENT:     Head: Normocephalic and atraumatic.  Eyes:     Pupils: Pupils are equal, round, and reactive to light.  Neck:     Thyroid: No thyromegaly.  Cardiovascular:     Rate and Rhythm: Normal rate and regular rhythm.     Heart sounds: Normal heart sounds. No murmur heard.   Pulmonary:     Effort: Pulmonary effort is normal. No respiratory distress.     Breath sounds: Normal breath sounds. No wheezing.  Abdominal:     General: Bowel sounds are normal. There is no distension.     Palpations: Abdomen is soft.     Tenderness: There is no abdominal tenderness.  Musculoskeletal:        General: No tenderness. Normal range of motion.     Cervical back: Normal range of motion and neck supple.  Skin:    General: Skin is warm and dry.   Neurological:     Mental Status: She is alert and oriented to person, place, and time.     Cranial Nerves: No cranial nerve deficit.     Deep Tendon Reflexes: Reflexes are normal and symmetric.  Psychiatric:        Behavior: Behavior normal.        Thought Content: Thought content normal.        Judgment: Judgment normal.       BP 128/84   Pulse 90   Temp (!) 97.2 F (36.2 C) (Temporal)   Ht 5' 2.5" (1.588 m)   Wt (!) 218 lb (98.9 kg)   SpO2 100%   BMI 39.24 kg/m      Assessment & Plan:  Bailey Valdez comes in today with chief complaint of Hypertension and Tachycardia   Diagnosis and orders addressed:  1. Essential hypertension Will refill Bystolic today She has CPE scheduled and would like to do lab work at that time -Daily blood pressure log given with instructions on how to fill out and told to bring to next visit -Dash diet information discussed  -Exercise encouraged - Stress Management  - nebivolol (BYSTOLIC) 10 MG tablet; Take 1 tablet (10 mg total) by mouth daily.  Dispense: 90 tablet; Refill: 2  Evelina Dun, FNP

## 2020-01-07 ENCOUNTER — Encounter: Payer: 59 | Admitting: Family

## 2020-01-20 ENCOUNTER — Telehealth: Payer: 59 | Admitting: Physician Assistant

## 2020-01-20 DIAGNOSIS — L03119 Cellulitis of unspecified part of limb: Secondary | ICD-10-CM

## 2020-01-20 MED ORDER — CEPHALEXIN 500 MG PO CAPS: 500.0000 mg | ORAL_CAPSULE | Freq: Four times a day (QID) | ORAL | 0 refills | Status: AC

## 2020-01-20 NOTE — Progress Notes (Signed)
E Visit for Cellulitis  We are sorry that you are not feeling well. Here is how we plan to help!  Based on what you shared with me it looks like you have cellulitis.  Cellulitis looks like areas of skin redness, swelling, and warmth; it develops as a result of bacteria entering under the skin. Little red spots and/or bleeding can be seen in skin, and tiny surface sacs containing fluid can occur. Fever can be present. Cellulitis is almost always on one side of a body, and the lower limbs are the most common site of involvement.   I have prescribed:  Keflex 500mg  take one by mouth four times a day for 5 days  HOME CARE:  . Take your medications as ordered and take all of them, even if the skin irritation appears to be healing.   GET HELP RIGHT AWAY IF:  . Symptoms that don't begin to go away within 48 hours. . Severe redness persists or worsens . If the area turns color, spreads or swells. . If it blisters and opens, develops yellow-brown crust or bleeds. . You develop a fever or chills. . If the pain increases or becomes unbearable.  . Are unable to keep fluids and food down.  MAKE SURE YOU    Understand these instructions.  Will watch your condition.  Will get help right away if you are not doing well or get worse.  Thank you for choosing an e-visit. Your e-visit answers were reviewed by a board certified advanced clinical practitioner to complete your personal care plan. Depending upon the condition, your plan could have included both over the counter or prescription medications. Please review your pharmacy choice. Make sure the pharmacy is open so you can pick up prescription now. If there is a problem, you may contact your provider through and have the prescription routed to another pharmacy. Your safety is important to [employer]. If you have drug allergies check your prescription carefully.  For the next 24 hours you can use MyChart to ask questions about today's  visit, request a non-urgent call back, or ask for a work or school excuse. You will get an email in the next two days asking about your experience. I hope that your e-visit has been valuable and will speed your recovery.   5 minutes on chart

## 2020-03-17 ENCOUNTER — Ambulatory Visit (INDEPENDENT_AMBULATORY_CARE_PROVIDER_SITE_OTHER): Payer: 59

## 2020-03-17 DIAGNOSIS — Z23 Encounter for immunization: Secondary | ICD-10-CM | POA: Diagnosis not present

## 2020-03-17 NOTE — Progress Notes (Signed)
   Covid-19 Vaccination Clinic  Name:  Bailey Valdez    MRN: [id number] DOB: March 01, 1966  03/17/2020  Bailey Valdez was observed post Covid-19 immunization for 15 minutes without incident. She was provided with Vaccine Information Sheet and instruction to access the V-Safe system.   Bailey Valdez was instructed to call 911 with any severe reactions post vaccine: Marland Kitchen Difficulty breathing  . Swelling of face and throat  . A fast heartbeat  . A bad rash all over body  . Dizziness and weakness

## 2020-03-31 ENCOUNTER — Ambulatory Visit: Payer: 59 | Admitting: Family

## 2020-03-31 MED FILL — NEBIVOLOL HCL 10 MG TABS: 10 | 90 days supply | Qty: 90 | Fill #1

## 2020-04-20 ENCOUNTER — Encounter: Payer: Self-pay | Admitting: Family

## 2020-04-20 ENCOUNTER — Ambulatory Visit (INDEPENDENT_AMBULATORY_CARE_PROVIDER_SITE_OTHER): Payer: 59 | Admitting: Family

## 2020-04-20 ENCOUNTER — Other Ambulatory Visit: Payer: Self-pay

## 2020-04-20 VITALS — BP 132/77 | HR 77 | Ht 63.0 in | Wt 218.0 lb

## 2020-04-20 DIAGNOSIS — E669 Obesity, unspecified: Secondary | ICD-10-CM | POA: Diagnosis not present

## 2020-04-20 DIAGNOSIS — R079 Chest pain, unspecified: Secondary | ICD-10-CM | POA: Diagnosis not present

## 2020-04-20 DIAGNOSIS — R0602 Shortness of breath: Secondary | ICD-10-CM

## 2020-04-20 NOTE — Progress Notes (Signed)
Subjective:    Patient ID: Bailey Valdez, female    DOB: [birth year], 54 y.o.   MRN: [id number]  Chief Complaint  Patient presents with  . Chest Pain    Pt presents to the office today for intermittent chest pain. She reports the last three times she has had sex she has had chest pressure. She also is complaining of SOB with walking up stairs and exertion.  She does have increased anxiety as she helps her mother care for her father and stress with her children.  Chest Pain  This is a new problem. The current episode started 1 to 4 weeks ago. The onset quality is gradual. The problem occurs intermittently. The pain is present in the lateral region. The pain is at a severity of 4/10. The pain is mild. The quality of the pain is described as tightness and pressure. The pain does not radiate. Associated symptoms include exertional chest pressure, malaise/fatigue and shortness of breath. Pertinent negatives include no back pain, cough, syncope or vomiting.      Review of Systems  Constitutional: Positive for malaise/fatigue.  Respiratory: Positive for shortness of breath. Negative for cough.   Cardiovascular: Positive for chest pain. Negative for syncope.  Gastrointestinal: Negative for vomiting.  Musculoskeletal: Negative for back pain.  All other systems reviewed and are negative.      Objective:   Physical Exam Vitals reviewed.  Constitutional:      General: She is not in acute distress.    Appearance: She is well-developed. She is obese.  HENT:     Head: Normocephalic and atraumatic.     Right Ear: External ear normal.  Eyes:     Pupils: Pupils are equal, round, and reactive to light.  Neck:     Thyroid: No thyromegaly.  Cardiovascular:     Rate and Rhythm: Normal rate and regular rhythm.     Heart sounds: Normal heart sounds. No murmur heard.   Pulmonary:     Effort: Pulmonary effort is normal. No respiratory distress.     Breath sounds: Normal breath sounds. No wheezing.   Abdominal:     General: Bowel sounds are normal. There is no distension.     Palpations: Abdomen is soft.     Tenderness: There is no abdominal tenderness.  Musculoskeletal:        General: No tenderness. Normal range of motion.     Cervical back: Normal range of motion and neck supple.  Skin:    General: Skin is warm and dry.  Neurological:     Mental Status: She is alert and oriented to person, place, and time.     Cranial Nerves: No cranial nerve deficit.     Deep Tendon Reflexes: Reflexes are normal and symmetric.  Psychiatric:        Behavior: Behavior normal.        Thought Content: Thought content normal.        Judgment: Judgment normal.      BP 132/77   Pulse 77   Ht 5\' 3"  (1.6 m)   Wt 218 lb (98.9 kg)   SpO2 98%   BMI 38.62 kg/m       Assessment & Plan:  Bailey Valdez comes in today with chief complaint of Chest Pain   Diagnosis and orders addressed:  1. Chest pain, unspecified type - EKG 12-Lead - Ambulatory referral to Cardiology  2. SOB (shortness of breath) on exertion - Ambulatory referral to Cardiology  3. Obesity (  BMI 30-39.9) - Ambulatory referral to Cardiology   EKG stable Follow up with Cardiologists Red flags discussed to go to ED Encourage healthy diet and exercise     Jannifer Rodney, FNP

## 2020-04-29 ENCOUNTER — Other Ambulatory Visit: Payer: Self-pay | Admitting: Family

## 2020-05-13 ENCOUNTER — Other Ambulatory Visit: Payer: Self-pay

## 2020-05-13 ENCOUNTER — Other Ambulatory Visit: Payer: 59

## 2020-05-13 DIAGNOSIS — Z1329 Encounter for screening for other suspected endocrine disorder: Secondary | ICD-10-CM | POA: Diagnosis not present

## 2020-05-13 DIAGNOSIS — E785 Hyperlipidemia, unspecified: Secondary | ICD-10-CM | POA: Diagnosis not present

## 2020-05-13 DIAGNOSIS — I10 Essential (primary) hypertension: Secondary | ICD-10-CM

## 2020-05-14 LAB — CMP14+EGFR
ALT: 29 IU/L (ref 0–32)
AST: 24 IU/L (ref 0–40)
Albumin/Globulin Ratio: 1.7 (ref 1.2–2.2)
Albumin: 4.2 g/dL (ref 3.8–4.9)
Alkaline Phosphatase: 73 IU/L (ref 44–121)
BUN/Creatinine Ratio: 15 (ref 9–23)
BUN: 12 mg/dL (ref 6–24)
Bilirubin Total: 0.2 mg/dL (ref 0.0–1.2)
CO2: 24 mmol/L (ref 20–29)
Calcium: 9.5 mg/dL (ref 8.7–10.2)
Chloride: 102 mmol/L (ref 96–106)
Creatinine, Ser: 0.79 mg/dL (ref 0.57–1.00)
GFR calc Af Amer: 97 mL/min/{1.73_m2} (ref >59)
GFR calc non Af Amer: 84 mL/min/{1.73_m2} (ref >59)
Globulin, Total: 2.5 g/dL (ref 1.5–4.5)
Glucose: 91 mg/dL (ref 65–99)
Potassium: 4.4 mmol/L (ref 3.5–5.2)
Sodium: 141 mmol/L (ref 134–144)
Total Protein: 6.7 g/dL (ref 6.0–8.5)

## 2020-05-14 LAB — CBC WITH DIFFERENTIAL/PLATELET
Basophils Absolute: 0.1 10*3/uL (ref 0.0–0.2)
Basos: 1 %
EOS (ABSOLUTE): 0.2 10*3/uL (ref 0.0–0.4)
Eos: 3 %
Hematocrit: 45.4 % (ref 34.0–46.6)
Hemoglobin: 15 g/dL (ref 11.1–15.9)
Immature Grans (Abs): 0 10*3/uL (ref 0.0–0.1)
Immature Granulocytes: 0 %
Lymphocytes Absolute: 2.1 10*3/uL (ref 0.7–3.1)
Lymphs: 33 %
MCH: 29.4 pg (ref 26.6–33.0)
MCHC: 33 g/dL (ref 31.5–35.7)
MCV: 89 fL (ref 79–97)
Monocytes Absolute: 0.5 10*3/uL (ref 0.1–0.9)
Monocytes: 9 %
Neutrophils Absolute: 3.3 10*3/uL (ref 1.4–7.0)
Neutrophils: 54 %
Platelets: 246 10*3/uL (ref 150–450)
RBC: 5.11 x10E6/uL (ref 3.77–5.28)
RDW: 12.6 % (ref 11.7–15.4)
WBC: 6.2 10*3/uL (ref 3.4–10.8)

## 2020-05-14 LAB — LIPID PANEL
Chol/HDL Ratio: 4.1 ratio (ref 0.0–4.4)
Cholesterol, Total: 168 mg/dL (ref 100–199)
HDL: 41 mg/dL (ref >39)
LDL Chol Calc (NIH): 108 mg/dL — ABNORMAL HIGH (ref 0–99)
Triglycerides: 103 mg/dL (ref 0–149)
VLDL Cholesterol Cal: 19 mg/dL (ref 5–40)

## 2020-05-14 LAB — THYROID PANEL WITH TSH
Free Thyroxine Index: 1.7 (ref 1.2–4.9)
T3 Uptake Ratio: 27 % (ref 24–39)
T4, Total: 6.3 ug/dL (ref 4.5–12.0)
TSH: 3.53 u[IU]/mL (ref 0.450–4.500)

## 2020-05-17 ENCOUNTER — Telehealth: Payer: Self-pay | Admitting: Pharmacist

## 2020-05-17 NOTE — Telephone Encounter (Signed)
The 10-year ASCVD risk score Denman George DC Montez Hageman., et al., 2013) is: 3.4%   Values used to calculate the score:     Age: 55 years     Sex: Female     Is Non-Hispanic African American: No     Diabetic: No     Tobacco smoker: No     Systolic Blood Pressure: 132 mmHg     Is BP treated: Yes     HDL Cholesterol: 41 mg/dL     Total Cholesterol: 168 mg/dL

## 2020-06-02 ENCOUNTER — Other Ambulatory Visit: Payer: Self-pay | Admitting: Family

## 2020-06-02 DIAGNOSIS — Z1231 Encounter for screening mammogram for malignant neoplasm of breast: Secondary | ICD-10-CM

## 2020-06-18 MED FILL — VIIBRYD 40 MG TABLET: 40 | 90 days supply | Qty: 90 | Fill #1

## 2020-06-18 MED FILL — NEBIVOLOL HCL 10 MG TABS: 10 | 90 days supply | Qty: 90 | Fill #2

## 2020-06-29 ENCOUNTER — Other Ambulatory Visit: Payer: 59

## 2020-06-29 ENCOUNTER — Other Ambulatory Visit: Payer: Self-pay

## 2020-06-29 DIAGNOSIS — Z1152 Encounter for screening for COVID-19: Secondary | ICD-10-CM | POA: Diagnosis not present

## 2020-06-30 LAB — NOVEL CORONAVIRUS, NAA: SARS-CoV-2, NAA: NOT DETECTED

## 2020-06-30 LAB — SARS-COV-2, NAA 2 DAY TAT

## 2020-07-07 ENCOUNTER — Other Ambulatory Visit: Payer: Self-pay

## 2020-07-07 ENCOUNTER — Ambulatory Visit
Admission: RE | Admit: 2020-07-07 | Discharge: 2020-07-07 | Disposition: A | Discharge status: Home / Self Care | Payer: 59 | Source: Ambulatory Visit | Attending: Family | Admitting: Family

## 2020-07-07 DIAGNOSIS — Z1231 Encounter for screening mammogram for malignant neoplasm of breast: Secondary | ICD-10-CM | POA: Diagnosis not present

## 2020-07-13 ENCOUNTER — Other Ambulatory Visit: Payer: Self-pay | Admitting: Family

## 2020-07-15 ENCOUNTER — Telehealth: Payer: Self-pay | Admitting: Cardiology

## 2020-07-15 ENCOUNTER — Other Ambulatory Visit: Payer: Self-pay | Admitting: Family Medicine

## 2020-07-15 DIAGNOSIS — R079 Chest pain, unspecified: Secondary | ICD-10-CM

## 2020-07-15 NOTE — Telephone Encounter (Signed)
Patient requesting to switch from Dr. Branch to Dr. Pemberton.  

## 2020-07-16 NOTE — Telephone Encounter (Signed)
Ok with me   J Brittanni Cariker MD 

## 2020-07-22 NOTE — Telephone Encounter (Signed)
Okay with me 

## 2020-08-05 ENCOUNTER — Other Ambulatory Visit (HOSPITAL_BASED_OUTPATIENT_CLINIC_OR_DEPARTMENT_OTHER): Payer: Self-pay

## 2020-08-06 ENCOUNTER — Other Ambulatory Visit (HOSPITAL_COMMUNITY): Payer: Self-pay | Admitting: Emergency Medicine

## 2020-08-09 NOTE — Progress Notes (Signed)
Cardiology Office Note:    Date:  08/11/2020   ID:  Bailey Valdez, DOB 1965/10/19, MRN [id number]  PCP:  Junie Spencer, FNP   [facility]  Cardiologist:  Dina Rich, MD  Advanced Practice Provider:  No care team member to display Electrophysiologist:  None    Referring MD: Junie Spencer, FNP    History of Present Illness:    Bailey Valdez is a 55 y.o. female with a hx of HTN who was previously followed by Dr. Wyline Mood now presenting to clinic for evaluation of chest pain.  Last saw Dr. Wyline Mood on 06/05/18 for evaluation of dyspnea on exertion. TTE obtained which showed normal LVEF, no significant valve disease.  Today, the patient states that about 2 years ago she went zip lining and when walking up an incline, she felt very short of breath, diaphoretic with generalized malaise. Since that time, she has had two episodes of exertional chest pain during intercourse as well as worsening dyspnea on exertion. Specifically, she finds that she is very short of breath with walking stairs or an incline. Given that her symptoms have persisted and her father has a history of bypass in 44s and 2 strokes, she wanted to be evaluated.   History reviewed. No pertinent past medical history.  Past Surgical History:  Procedure Laterality Date  . ABDOMINOPLASTY     1994    Current Medications: Current Meds  Medication Sig  . fluticasone (FLONASE) 50 MCG/ACT nasal spray Place 2 sprays into both nostrils daily.  . metoprolol tartrate (LOPRESSOR) 100 MG tablet Take 1 tablet by mouth 2 hours prior to procedure  . nebivolol (BYSTOLIC) 10 MG tablet Take 1 tablet (10 mg total) by mouth daily.  . rosuvastatin (CRESTOR) 10 MG tablet Take 1 tablet (10 mg total) by mouth daily.  . Vilazodone HCl (VIIBRYD) 40 MG TABS TAKE 1/2-1 TABLET BY MOUTH DAILY     Allergies:   Penicillins and Sulfa antibiotics   Social History   Socioeconomic History  . Marital status: Married     Spouse name: Not on file  . Number of children: Not on file  . Years of education: Not on file  . Highest education level: Not on file  Occupational History  . Not on file  Tobacco Use  . Smoking status: Never Smoker  . Smokeless tobacco: Never Used  Vaping Use  . Vaping Use: Never used  Substance and Sexual Activity  . Alcohol use: No    Alcohol/week: 0.0 standard drinks  . Drug use: No  . Sexual activity: Not on file  Other Topics Concern  . Not on file  Social History Narrative  . Not on file   Social Determinants of Health   Financial Resource Strain: Not on file  Food Insecurity: Not on file  Transportation Needs: Not on file  Physical Activity: Not on file  Stress: Not on file  Social Connections: Not on file     Family History: The patient's family history includes Breast cancer in her maternal grandmother; Colon cancer in her maternal uncle; Crohn's disease in her maternal uncle and mother; Heart disease in her father.  ROS:   Please see the history of present illness.    Review of Systems  Constitutional: Negative for chills and fever.  HENT: Negative for hearing loss.   Eyes: Negative for blurred vision and redness.  Respiratory: Positive for shortness of breath.   Cardiovascular: Positive for chest pain. Negative for  palpitations, orthopnea, claudication, leg swelling and PND.  Gastrointestinal: Negative for melena, nausea and vomiting.  Genitourinary: Negative for dysuria and flank pain.  Musculoskeletal: Negative for myalgias.  Neurological: Negative for dizziness and loss of consciousness.  Endo/Heme/Allergies: Negative for polydipsia.  Psychiatric/Behavioral: Negative for substance abuse.    EKGs/Labs/Other Studies Reviewed:    The following studies were reviewed today: TTE Jun 15, 2018: IMPRESSIONS  1. The left ventricle has normal systolic function of 60-65%. The cavity  size is normal. There is mild focal basal septal left ventricular wall   thickness. Echo evidence of impaired relaxation diastolic filling  patterns. Normal left ventricular filling  pressures.  2. Normal left atrial size.  3. Normal right atrial size.  4. Normal tricuspid valve.  5. The aortic root is normal is size and structure.  6. No atrial level shunt detected by color flow Doppler.   EKG:  EKG 04/20/20: NSR  Recent Labs: 05/13/2020: ALT 29; BUN 12; Creatinine, Ser 0.79; Hemoglobin 15.0; Platelets 246; Potassium 4.4; Sodium 141; TSH 3.530  Recent Lipid Panel    Component Value Date/Time   CHOL 168 05/13/2020 0755   TRIG 103 05/13/2020 0755   TRIG 100 07/17/2013 1036   HDL 41 05/13/2020 0755   HDL 46 07/17/2013 1036   CHOLHDL 4.1 05/13/2020 0755   LDLCALC 108 (H) 05/13/2020 0755   LDLCALC 108 (H) 07/17/2013 1036     Risk Assessment/Calculations:       Physical Exam:    VS:  BP 110/80   Pulse 86   Ht 5\' 3"  (1.6 m)   Wt 221 lb (100.2 kg)   SpO2 95%   BMI 39.15 kg/m     Wt Readings from Last 3 Encounters:  08/11/20 221 lb (100.2 kg)  04/20/20 218 lb (98.9 kg)  12/11/19 (!) 218 lb (98.9 kg)     GEN:  Well nourished, well developed in no acute distress HEENT: Normal NECK: No JVD; No carotid bruits CARDIAC: RRR, no murmurs, rubs, gallops RESPIRATORY:  Clear to auscultation without rales, wheezing or rhonchi  ABDOMEN: Soft, non-tender, non-distended MUSCULOSKELETAL:  No edema; No deformity  SKIN: Warm and dry NEUROLOGIC:  Alert and oriented x 3 PSYCHIATRIC:  Normal affect   ASSESSMENT:    1. Precordial pain   2. Pure hypercholesterolemia   3. Primary hypertension   4. Obesity (BMI 30-39.9)   5. Dyspnea on exertion   6. Family history of premature CAD    PLAN:    In order of problems listed above:  #Chest Pain: #DOE: #Family history of premature CAD: Patient with increased dyspnea on exertion as well as 2 episodes of exertional substernal chest pain over the past couple of years. Symptoms mostly noticeable when  walking inclines or stairs with concern for underlying angina. Has risk factors of HTN, borderline HLD, obesity and family history of premature CAD in her father. Will check coronary CTA.  -Check coronary CTA -TTE 2020 with normal BiV function, no significant valve disease -Start crestor 10mg  daily with goal LDL dependent on CTA findings  #HLD: LDL 108.  -Will start crestor now given risk factors and family history -Can adjust based on CTA findings  #HTN: Well controlled.  -Continue bystolic 10mg  daily  #Obesity: -Continue dietary and exercise modifications   Medication Adjustments/Labs and Tests Ordered: Current medicines are reviewed at length with the patient today.  Concerns regarding medicines are outlined above.  Orders Placed This Encounter  Procedures  . CT CORONARY MORPH W/CTA COR W/SCORE W/CA W/CM &/  OR WO/CM  . CT CORONARY FRACTIONAL FLOW RESERVE DATA PREP  . CT CORONARY FRACTIONAL FLOW RESERVE FLUID ANALYSIS   Meds ordered this encounter  Medications  . rosuvastatin (CRESTOR) 10 MG tablet    Sig: Take 1 tablet (10 mg total) by mouth daily.    Dispense:  90 tablet    Refill:  3  . metoprolol tartrate (LOPRESSOR) 100 MG tablet    Sig: Take 1 tablet by mouth 2 hours prior to procedure    Dispense:  1 tablet    Refill:  0    Patient Instructions  Medication Instructions:  Start Rosuvastatin Crestor 10 mg by mouth daily  *If you need a refill on your cardiac medications before your next appointment, please call your pharmacy*   Lab Work: BMET Today If you have labs (blood work) drawn today and your tests are completely normal, you will receive your results only by: Marland Kitchen. MyChart Message (if you have MyChart) OR . A paper copy in the mail If you have any lab test that is abnormal or we need to change your treatment, we will call you to review the results.   Testing/Procedures: Your provider has recommended a Coronary CT    Follow-Up: At [facility], you  and your health needs are our priority.  As part of our continuing mission to provide you with exceptional heart care, we have created designated Provider Care Teams.  These Care Teams include your primary Cardiologist (physician) and Advanced Practice Providers (APPs -  Physician Assistants and Nurse Practitioners) who all work together to provide you with the care you need, when you need it.  We recommend signing up for the patient portal called "MyChart".  Sign up information is provided on this After Visit Summary.  MyChart is used to connect with patients for Virtual Visits (Telemedicine).  Patients are able to view lab/test results, encounter notes, upcoming appointments, etc.  Non-urgent messages can be sent to your provider as well.   To learn more about what you can do with MyChart, go to [email or web address].    Your next appointment:   As needed  The format for your next appointment:   In Person  Provider:   You may see Dr. Laurance FlattenHeather Jamiesha Victoria or one of the following Advanced Practice Providers on your designated Care Team:    Tereso NewcomerScott Weaver, PA-C  Vin Fitch[place]Bhagat, [place]-C    Other Instructions Your cardiac CT will be scheduled at one of the below locations:   [facility] [address] [place], [place] [address] [phone]  If scheduled at [facility], please arrive at the [facility] main entrance (entrance A) of [facility]  30 minutes prior to test start time. Proceed to the [facility] Radiology Department (first floor) to check-in and test prep.  Please follow these instructions carefully (unless otherwise directed):  On the Night Before the Test: . Be sure to Drink plenty of water. . Do not consume any caffeinated/decaffeinated beverages or chocolate 12 hours prior to your test. . Do not take any antihistamines 12 hours prior to your test.  On the Day of the Test: . Drink plenty of water until 1 hour prior to the test. . Do not eat any  food 4 hours prior to the test. . You may take your regular medications prior to the test.  . Take metoprolol (Lopressor) two hours prior to test. Hold if your hr is less than 60.  Marland Kitchen. HOLD Furosemide/Hydrochlorothiazide morning of the  test. . FEMALES- please wear underwire-free bra if available        After the Test: . Drink plenty of water. . After receiving IV contrast, you may experience a mild flushed feeling. This is normal. . On occasion, you may experience a mild rash up to 24 hours after the test. This is not dangerous. If this occurs, you can take Benadryl 25 mg and increase your fluid intake. . If you experience trouble breathing, this can be serious. If it is severe call 911 IMMEDIATELY. If it is mild, please call our office. . If you take any of these medications: Glipizide/Metformin, Avandament, Glucavance, please do not take 48 hours after completing test unless otherwise instructed.   Once we have confirmed authorization from your insurance company, we will call you to set up a date and time for your test. Based on how quickly your insurance processes prior authorizations requests, please allow up to 4 weeks to be contacted for scheduling your Cardiac CT appointment. Be advised that routine Cardiac CT appointments could be scheduled as many as 8 weeks after your provider has ordered it.  For non-scheduling related questions, please contact the cardiac imaging nurse navigator should you have any questions/concerns: Rockwell Alexandria, Cardiac Imaging Nurse Navigator Larey Brick, Cardiac Imaging Nurse Navigator [facility] Direct Office Dial: [phone]   For scheduling needs, including cancellations and rescheduling, please call [place], [phone].       Signed, Meriam Sprague, MD  08/11/2020 9:52 AM    [facility]

## 2020-08-10 ENCOUNTER — Other Ambulatory Visit: Payer: Self-pay | Admitting: Family Medicine

## 2020-08-10 MED ORDER — FLUTICASONE PROPIONATE 50 MCG/ACT NA SUSP: 2.0000 | Freq: Every day | NASAL | 6 refills | Status: DC

## 2020-08-10 MED FILL — FLUTICASONE PROP 50 MCG SPR: 50 | 30 days supply | Qty: 16 | Fill #0

## 2020-08-11 ENCOUNTER — Other Ambulatory Visit: Payer: Self-pay

## 2020-08-11 ENCOUNTER — Other Ambulatory Visit: Payer: Self-pay | Admitting: Cardiology

## 2020-08-11 ENCOUNTER — Ambulatory Visit: Payer: 59 | Admitting: Cardiology

## 2020-08-11 ENCOUNTER — Encounter: Payer: Self-pay | Admitting: Cardiology

## 2020-08-11 VITALS — BP 110/80 | HR 86 | Ht 63.0 in | Wt 221.0 lb

## 2020-08-11 DIAGNOSIS — I10 Essential (primary) hypertension: Secondary | ICD-10-CM | POA: Diagnosis not present

## 2020-08-11 DIAGNOSIS — R072 Precordial pain: Secondary | ICD-10-CM

## 2020-08-11 DIAGNOSIS — R0600 Dyspnea, unspecified: Secondary | ICD-10-CM

## 2020-08-11 DIAGNOSIS — E7800 Pure hypercholesterolemia, unspecified: Secondary | ICD-10-CM

## 2020-08-11 DIAGNOSIS — Z8249 Family history of ischemic heart disease and other diseases of the circulatory system: Secondary | ICD-10-CM

## 2020-08-11 DIAGNOSIS — E669 Obesity, unspecified: Secondary | ICD-10-CM

## 2020-08-11 DIAGNOSIS — R0609 Other forms of dyspnea: Secondary | ICD-10-CM

## 2020-08-11 MED ORDER — METOPROLOL TARTRATE 100 MG PO TABS: ORAL_TABLET | ORAL | 0 refills | Status: DC

## 2020-08-11 MED ORDER — ROSUVASTATIN CALCIUM 10 MG PO TABS: 10.0000 mg | ORAL_TABLET | Freq: Every day | ORAL | 3 refills | Status: DC

## 2020-08-11 MED FILL — METOPROLOL TARTRATE 100 MG: 100 | 1 days supply | Qty: 1 | Fill #0

## 2020-08-11 MED FILL — ROSUVASTATIN CALCIUM 10 MG: 10 | 90 days supply | Qty: 90 | Fill #0

## 2020-08-11 NOTE — Patient Instructions (Addendum)
Medication Instructions:  Start Rosuvastatin Crestor 10 mg by mouth daily  *If you need a refill on your cardiac medications before your next appointment, please call your pharmacy*   Lab Work: BMET Today If you have labs (blood work) drawn today and your tests are completely normal, you will receive your results only by: Marland Kitchen MyChart Message (if you have MyChart) OR . A paper copy in the mail If you have any lab test that is abnormal or we need to change your treatment, we will call you to review the results.   Testing/Procedures: Your provider has recommended a Coronary CT    Follow-Up: At [facility], you and your health needs are our priority.  As part of our continuing mission to provide you with exceptional heart care, we have created designated Provider Care Teams.  These Care Teams include your primary Cardiologist (physician) and Advanced Practice Providers (APPs -  Physician Assistants and Nurse Practitioners) who all work together to provide you with the care you need, when you need it.  We recommend signing up for the patient portal called "MyChart".  Sign up information is provided on this After Visit Summary.  MyChart is used to connect with patients for Virtual Visits (Telemedicine).  Patients are able to view lab/test results, encounter notes, upcoming appointments, etc.  Non-urgent messages can be sent to your provider as well.   To learn more about what you can do with MyChart, go to [email or web address].    Your next appointment:   As needed  The format for your next appointment:   In Person  Provider:   You may see Dr. Laurance Flatten or one of the following Advanced Practice Providers on your designated Care Team:    Tereso Newcomer, PA-C  Vin [place], [place]    Other Instructions Your cardiac CT will be scheduled at one of the below locations:   [facility] [address] [place], [place] [address] [phone]  If scheduled at [facility], please arrive at the [facility] main entrance (entrance A) of [facility] 30 minutes prior to test start time. Proceed to the [facility] Radiology Department (first floor) to check-in and test prep.  Please follow these instructions carefully (unless otherwise directed):  On the Night Before the Test: . Be sure to Drink plenty of water. . Do not consume any caffeinated/decaffeinated beverages or chocolate 12 hours prior to your test. . Do not take any antihistamines 12 hours prior to your test.  On the Day of the Test: . Drink plenty of water until 1 hour prior to the test. . Do not eat any food 4 hours prior to the test. . You may take your regular medications prior to the test.  . Take metoprolol (Lopressor) two hours prior to test. Hold if your hr is less than 60.  Marland Kitchen HOLD Furosemide/Hydrochlorothiazide morning of the test. . FEMALES- please wear underwire-free bra if available        After the Test: . Drink plenty of water. . After receiving IV contrast, you may experience a mild flushed feeling. This is normal. . On occasion, you may experience a mild rash up to 24 hours after the test. This is not dangerous. If this occurs, you can take Benadryl 25 mg and increase your fluid intake. . If you experience trouble breathing, this can be serious. If it is severe call 911 IMMEDIATELY. If it is mild, please call our office. . If you take any  of these medications: Glipizide/Metformin, Avandament, Glucavance, please do not take 48 hours after completing test unless otherwise instructed.   Once we have confirmed authorization from your insurance company, we will call you to set up a date and time for your test. Based on how quickly your insurance processes prior authorizations requests, please allow up to 4 weeks to be contacted for scheduling your Cardiac CT appointment. Be advised that routine Cardiac CT appointments could be scheduled as many as 8 weeks after your  provider has ordered it.  For non-scheduling related questions, please contact the cardiac imaging nurse navigator should you have any questions/concerns: Rockwell Alexandria, Cardiac Imaging Nurse Navigator Larey Brick, Cardiac Imaging Nurse Navigator [facility] Direct Office Dial: [phone]   For scheduling needs, including cancellations and rescheduling, please call [place], [phone].

## 2020-08-13 ENCOUNTER — Other Ambulatory Visit: Payer: 59

## 2020-08-13 DIAGNOSIS — I10 Essential (primary) hypertension: Secondary | ICD-10-CM | POA: Diagnosis not present

## 2020-08-14 LAB — BMP8+EGFR
BUN/Creatinine Ratio: 14 (ref 9–23)
BUN: 12 mg/dL (ref 6–24)
CO2: 25 mmol/L (ref 20–29)
Calcium: 9 mg/dL (ref 8.7–10.2)
Chloride: 104 mmol/L (ref 96–106)
Creatinine, Ser: 0.84 mg/dL (ref 0.57–1.00)
Glucose: 77 mg/dL (ref 65–99)
Potassium: 4.3 mmol/L (ref 3.5–5.2)
Sodium: 142 mmol/L (ref 134–144)
eGFR: 82 mL/min/{1.73_m2} (ref >59)

## 2020-08-18 ENCOUNTER — Other Ambulatory Visit (HOSPITAL_COMMUNITY): Payer: 59

## 2020-09-06 ENCOUNTER — Telehealth (HOSPITAL_COMMUNITY): Payer: Self-pay | Admitting: Emergency Medicine

## 2020-09-06 NOTE — Telephone Encounter (Signed)
Attempted to call patient regarding upcoming cardiac CT appointment. °Left message on voicemail with name and callback number °[name] RN Navigator Cardiac Imaging °[facility] °[phone] Office °[phone] Cell ° °

## 2020-09-08 ENCOUNTER — Ambulatory Visit (HOSPITAL_COMMUNITY)
Admission: RE | Admit: 2020-09-08 | Discharge: 2020-09-08 | Disposition: A | Discharge status: Home / Self Care | Payer: 59 | Source: Ambulatory Visit | Attending: Cardiology | Admitting: Cardiology

## 2020-09-08 ENCOUNTER — Encounter (HOSPITAL_COMMUNITY): Payer: Self-pay

## 2020-09-08 ENCOUNTER — Other Ambulatory Visit: Payer: Self-pay

## 2020-09-08 ENCOUNTER — Other Ambulatory Visit: Payer: Self-pay | Admitting: Family

## 2020-09-08 DIAGNOSIS — R072 Precordial pain: Secondary | ICD-10-CM

## 2020-09-08 DIAGNOSIS — R931 Abnormal findings on diagnostic imaging of heart and coronary circulation: Secondary | ICD-10-CM | POA: Diagnosis not present

## 2020-09-08 DIAGNOSIS — Z006 Encounter for examination for normal comparison and control in clinical research program: Secondary | ICD-10-CM

## 2020-09-08 HISTORY — DX: Essential (primary) hypertension: I10

## 2020-09-08 MED ORDER — POLYMYXIN B-TRIMETHOPRIM 10000-0.1 UNIT/ML-% OP SOLN: 1.0000 [drp] | Freq: Four times a day (QID) | OPHTHALMIC | 0 refills | Status: DC

## 2020-09-08 MED ORDER — METOPROLOL TARTRATE 5 MG/5ML IV SOLN
INTRAVENOUS | Status: AC
  Filled 2020-09-08: qty 10

## 2020-09-08 MED ORDER — NITROGLYCERIN 0.4 MG SL SUBL
SUBLINGUAL_TABLET | SUBLINGUAL | Status: AC
  Filled 2020-09-08: qty 2

## 2020-09-08 MED ORDER — NITROGLYCERIN 0.4 MG SL SUBL
0.8000 mg | SUBLINGUAL_TABLET | Freq: Once | SUBLINGUAL | Status: AC
  Administered 2020-09-08: 0.8 mg via SUBLINGUAL

## 2020-09-08 MED ORDER — METOPROLOL TARTRATE 5 MG/5ML IV SOLN: 10.0000 mg | Freq: Once | INTRAVENOUS | Status: DC

## 2020-09-08 MED ORDER — IOHEXOL 350 MG/ML SOLN
100.0000 mL | Freq: Once | INTRAVENOUS | Status: AC | PRN
  Administered 2020-09-08: 100 mL via INTRAVENOUS

## 2020-09-08 NOTE — Progress Notes (Signed)
Patient tolerated CT well. Gave patient a bottle of water to drink. Vital signs stable encourage to drink water throughout day.Reasons explained and verbalized understanding. Ambulated steady gait.

## 2020-09-08 NOTE — Research (Signed)
IDENTIFY Informed Consent                  Subject Name: Bailey Valdez    Subject met inclusion and exclusion criteria.  The informed consent form, study requirements and expectations were reviewed with the subject and questions and concerns were addressed prior to the signing of the consent form.  The subject verbalized understanding of the trial requirements.  The subject agreed to participate in the IDENTIFY trial and signed the informed consent at 08:17AM on 09/08/20.  The informed consent was obtained prior to performance of any protocol-specific procedures for the subject.  A copy of the signed informed consent was given to the subject and a copy was placed in the subject's medical record.   Meade Maw , Naval architect

## 2020-09-08 NOTE — Progress Notes (Signed)
Pt calls complaining of bacterial conjunctivitis. Polytrim Prescription sent to pharmacy

## 2020-09-20 ENCOUNTER — Encounter: Payer: Self-pay | Admitting: Family

## 2020-09-20 ENCOUNTER — Ambulatory Visit (INDEPENDENT_AMBULATORY_CARE_PROVIDER_SITE_OTHER): Payer: 59 | Admitting: Family

## 2020-09-20 VITALS — BP 136/84 | HR 77 | Ht 63.0 in | Wt 215.0 lb

## 2020-09-20 DIAGNOSIS — E785 Hyperlipidemia, unspecified: Secondary | ICD-10-CM | POA: Diagnosis not present

## 2020-09-20 DIAGNOSIS — I10 Essential (primary) hypertension: Secondary | ICD-10-CM | POA: Diagnosis not present

## 2020-09-20 DIAGNOSIS — F321 Major depressive disorder, single episode, moderate: Secondary | ICD-10-CM | POA: Insufficient documentation

## 2020-09-20 DIAGNOSIS — Z0001 Encounter for general adult medical examination with abnormal findings: Secondary | ICD-10-CM

## 2020-09-20 DIAGNOSIS — F411 Generalized anxiety disorder: Secondary | ICD-10-CM | POA: Diagnosis not present

## 2020-09-20 NOTE — Patient Instructions (Signed)
Health Maintenance, Female Adopting a healthy lifestyle and getting preventive care are important in promoting health and wellness. Ask your health care provider about:  The right schedule for you to have regular tests and exams.  Things you can do on your own to prevent diseases and keep yourself healthy. What should I know about diet, weight, and exercise? Eat a healthy diet  Eat a diet that includes plenty of vegetables, fruits, low-fat dairy products, and lean protein.  Do not eat a lot of foods that are high in solid fats, added sugars, or sodium.   Maintain a healthy weight Body mass index (BMI) is used to identify weight problems. It estimates body fat based on height and weight. Your health care provider can help determine your BMI and help you achieve or maintain a healthy weight. Get regular exercise Get regular exercise. This is one of the most important things you can do for your health. Most adults should:  Exercise for at least 150 minutes each week. The exercise should increase your heart rate and make you sweat (moderate-intensity exercise).  Do strengthening exercises at least twice a week. This is in addition to the moderate-intensity exercise.  Spend less time sitting. Even light physical activity can be beneficial. Watch cholesterol and blood lipids Have your blood tested for lipids and cholesterol at 57 years of age, then have this test every 5 years. Have your cholesterol levels checked more often if:  Your lipid or cholesterol levels are high.  You are older than 57 years of age.  You are at high risk for heart disease. What should I know about cancer screening? Depending on your health history and family history, you may need to have cancer screening at various ages. This may include screening for:  Breast cancer.  Cervical cancer.  Colorectal cancer.  Skin cancer.  Lung cancer. What should I know about heart disease, diabetes, and high blood  pressure? Blood pressure and heart disease  High blood pressure causes heart disease and increases the risk of stroke. This is more likely to develop in people who have high blood pressure readings, are of African descent, or are overweight.  Have your blood pressure checked: ? Every 3-5 years if you are 18-39 years of age. ? Every year if you are 40 years old or older. Diabetes Have regular diabetes screenings. This checks your fasting blood sugar level. Have the screening done:  Once every three years after age 40 if you are at a normal weight and have a low risk for diabetes.  More often and at a younger age if you are overweight or have a high risk for diabetes. What should I know about preventing infection? Hepatitis B If you have a higher risk for hepatitis B, you should be screened for this virus. Talk with your health care provider to find out if you are at risk for hepatitis B infection. Hepatitis C Testing is recommended for:  Everyone born from 1945 through 1965.  Anyone with known risk factors for hepatitis C. Sexually transmitted infections (STIs)  Get screened for STIs, including gonorrhea and chlamydia, if: ? You are sexually active and are younger than 57 years of age. ? You are older than 57 years of age and your health care provider tells you that you are at risk for this type of infection. ? Your sexual activity has changed since you were last screened, and you are at increased risk for chlamydia or gonorrhea. Ask your health care provider   if you are at risk.  Ask your health care provider about whether you are at high risk for HIV. Your health care provider may recommend a prescription medicine to help prevent HIV infection. If you choose to take medicine to prevent HIV, you should first get tested for HIV. You should then be tested every 3 months for as long as you are taking the medicine. Pregnancy  If you are about to stop having your period (premenopausal) and  you may become pregnant, seek counseling before you get pregnant.  Take 400 to 800 micrograms (mcg) of folic acid every day if you become pregnant.  Ask for birth control (contraception) if you want to prevent pregnancy. Osteoporosis and menopause Osteoporosis is a disease in which the bones lose minerals and strength with aging. This can result in bone fractures. If you are 65 years old or older, or if you are at risk for osteoporosis and fractures, ask your health care provider if you should:  Be screened for bone loss.  Take a calcium or vitamin D supplement to lower your risk of fractures.  Be given hormone replacement therapy (HRT) to treat symptoms of menopause. Follow these instructions at home: Lifestyle  Do not use any products that contain nicotine or tobacco, such as cigarettes, e-cigarettes, and chewing tobacco. If you need help quitting, ask your health care provider.  Do not use street drugs.  Do not share needles.  Ask your health care provider for help if you need support or information about quitting drugs. Alcohol use  Do not drink alcohol if: ? Your health care provider tells you not to drink. ? You are pregnant, may be pregnant, or are planning to become pregnant.  If you drink alcohol: ? Limit how much you use to 0-1 drink a day. ? Limit intake if you are breastfeeding.  Be aware of how much alcohol is in your drink. In the [place] one drink equals one 12 oz bottle of beer (355 mL), one 5 oz glass of wine (148 mL), or one 1 oz glass of hard liquor (44 mL). General instructions  Schedule regular health, dental, and eye exams.  Stay current with your vaccines.  Tell your health care provider if: ? You often feel depressed. ? You have ever been abused or do not feel safe at home. Summary  Adopting a healthy lifestyle and getting preventive care are important in promoting health and wellness.  Follow your health care provider's instructions about healthy  diet, exercising, and getting tested or screened for diseases.  Follow your health care provider's instructions on monitoring your cholesterol and blood pressure. This information is not intended to replace advice given to you by your health care provider. Make sure you discuss any questions you have with your health care provider. Document Revised: 04/24/2018 Document Reviewed: 04/24/2018 Elsevier Patient Education  2021 Elsevier Inc.  

## 2020-09-20 NOTE — Progress Notes (Signed)
Subjective:    Patient ID: Bailey Valdez, female    DOB: 03/07/1966, 55 y.o.   MRN: [id number]  No chief complaint on file.  Pt presents to the office today for CPE without pap. Pt is followed by Cardiologists for HTN and intermittent chest pain. She had a CT coronary scan.  Hypertension This is a chronic problem. The current episode started more than 1 year ago. The problem has been resolved since onset. The problem is controlled. Associated symptoms include anxiety and malaise/fatigue. Pertinent negatives include no peripheral edema or shortness of breath. Risk factors for coronary artery disease include dyslipidemia and obesity. Past treatments include beta blockers. The current treatment provides moderate improvement.  Hyperlipidemia This is a chronic problem. The current episode started more than 1 year ago. The problem is controlled. Recent lipid tests were reviewed and are normal. Exacerbating diseases include obesity. Pertinent negatives include no shortness of breath. Current antihyperlipidemic treatment includes statins. The current treatment provides moderate improvement of lipids. Risk factors for coronary artery disease include dyslipidemia, hypertension, a sedentary lifestyle and post-menopausal.  Anxiety Presents for follow-up visit. Symptoms include excessive worry, irritability, nervous/anxious behavior and restlessness. Patient reports no shortness of breath. Symptoms occur occasionally. The severity of symptoms is moderate.    Depression        This is a chronic problem.  The current episode started more than 1 year ago.   The problem occurs intermittently.  The problem has been waxing and waning since onset.  Associated symptoms include irritable, restlessness and sad.  Associated symptoms include no helplessness and no hopelessness.  Past medical history includes anxiety.       Review of Systems  Constitutional: Positive for irritability and malaise/fatigue.  Respiratory:  Negative for shortness of breath.   Psychiatric/Behavioral: Positive for depression. The patient is nervous/anxious.   All other systems reviewed and are negative.  Family History  Problem Relation Age of Onset  . Crohn's disease Mother   . Heart disease Father   . Colon cancer Maternal Uncle   . Crohn's disease Maternal Uncle   . Breast cancer Maternal Grandmother    Social History   Socioeconomic History  . Marital status: Married    Spouse name: Not on file  . Number of children: Not on file  . Years of education: Not on file  . Highest education level: Not on file  Occupational History  . Not on file  Tobacco Use  . Smoking status: Never Smoker  . Smokeless tobacco: Never Used  Vaping Use  . Vaping Use: Never used  Substance and Sexual Activity  . Alcohol use: No    Alcohol/week: 0.0 standard drinks  . Drug use: No  . Sexual activity: Not on file  Other Topics Concern  . Not on file  Social History Narrative  . Not on file   Social Determinants of Health   Financial Resource Strain: Not on file  Food Insecurity: Not on file  Transportation Needs: Not on file  Physical Activity: Not on file  Stress: Not on file  Social Connections: Not on file  Intimate Partner Violence: Not on file       Objective:   Physical Exam Vitals reviewed.  Constitutional:      General: She is irritable. She is not in acute distress.    Appearance: She is well-developed. She is obese.  HENT:     Head: Normocephalic and atraumatic.     Right Ear: Tympanic membrane normal.  Left Ear: Tympanic membrane normal.  Eyes:     Pupils: Pupils are equal, round, and reactive to light.  Neck:     Thyroid: No thyromegaly.  Cardiovascular:     Rate and Rhythm: Normal rate and regular rhythm.     Heart sounds: Normal heart sounds. No murmur heard.   Pulmonary:     Effort: Pulmonary effort is normal. No respiratory distress.     Breath sounds: Normal breath sounds. No wheezing.   Abdominal:     General: Bowel sounds are normal. There is no distension.     Palpations: Abdomen is soft.     Tenderness: There is no abdominal tenderness.  Musculoskeletal:        General: No tenderness. Normal range of motion.     Cervical back: Normal range of motion and neck supple.  Skin:    General: Skin is warm and dry.  Neurological:     Mental Status: She is alert and oriented to person, place, and time.     Cranial Nerves: No cranial nerve deficit.     Deep Tendon Reflexes: Reflexes are normal and symmetric.  Psychiatric:        Behavior: Behavior normal.        Thought Content: Thought content normal.        Judgment: Judgment normal.     BP 136/84   Pulse 77   Ht 5\' 3"  (1.6 m)   Wt 215 lb (97.5 kg)   SpO2 100%   BMI 38.09 kg/m        Assessment & Plan:  Bailey Valdez comes in today with chief complaint of Annual Exam   Diagnosis and orders addressed:  1. Annual physical exam  2. Morbid obesity (HCC)  3. Hyperlipidemia, unspecified hyperlipidemia type  4. Primary hypertension  5. GAD (generalized anxiety disorder)  6. Depression, major, single episode, moderate (HCC)    Labs pending Health Maintenance reviewed Diet and exercise encouraged  Follow up plan: 1 year    Wardell Heath, FNP

## 2020-09-23 ENCOUNTER — Telehealth: Payer: 59 | Admitting: Physician Assistant

## 2020-09-23 DIAGNOSIS — R300 Dysuria: Secondary | ICD-10-CM | POA: Diagnosis not present

## 2020-09-23 MED ORDER — NITROFURANTOIN MONOHYD MACRO 100 MG PO CAPS: 100.0000 mg | ORAL_CAPSULE | Freq: Two times a day (BID) | ORAL | 0 refills | Status: DC

## 2020-09-23 NOTE — Progress Notes (Signed)
I have spent 5 minutes in review of e-visit questionnaire, review and updating patient chart, medical decision making and response to patient.   Kelsi Benham Cody Delancey, PA-C    

## 2020-09-23 NOTE — Progress Notes (Signed)

## 2020-09-24 ENCOUNTER — Encounter: Payer: Self-pay | Admitting: Family

## 2020-10-27 ENCOUNTER — Other Ambulatory Visit (HOSPITAL_COMMUNITY): Payer: Self-pay

## 2020-10-27 ENCOUNTER — Other Ambulatory Visit: Payer: Self-pay | Admitting: Family

## 2020-10-27 DIAGNOSIS — I10 Essential (primary) hypertension: Secondary | ICD-10-CM

## 2020-10-28 ENCOUNTER — Other Ambulatory Visit (HOSPITAL_COMMUNITY): Payer: Self-pay

## 2020-10-28 MED ORDER — NEBIVOLOL HCL 10 MG PO TABS
ORAL_TABLET | Freq: Every day | ORAL | 1 refills | Status: DC
  Filled 2020-10-28: qty 90, 90d supply, fill #0

## 2020-11-22 ENCOUNTER — Other Ambulatory Visit: Payer: Self-pay | Admitting: Family

## 2020-11-22 MED ORDER — CEPHALEXIN 500 MG PO CAPS: 500.0000 mg | ORAL_CAPSULE | Freq: Two times a day (BID) | ORAL | 0 refills | Status: DC

## 2020-11-22 NOTE — Progress Notes (Signed)
PT complaining of urinary frequency, hesitancy, and dysuria  over the last three days that have worsen.   Keflex Prescription sent to pharmacy

## 2020-12-27 ENCOUNTER — Other Ambulatory Visit: Payer: Self-pay | Admitting: Family Medicine

## 2020-12-27 ENCOUNTER — Other Ambulatory Visit (HOSPITAL_COMMUNITY): Payer: Self-pay

## 2020-12-27 MED ORDER — VILAZODONE HCL 40 MG PO TABS
ORAL_TABLET | Freq: Every day | ORAL | 1 refills | Status: DC
  Filled 2020-12-27: qty 90, 90d supply, fill #0
  Filled 2021-06-27: qty 90, 90d supply, fill #1

## 2020-12-30 ENCOUNTER — Other Ambulatory Visit (HOSPITAL_COMMUNITY): Payer: Self-pay

## 2020-12-30 ENCOUNTER — Other Ambulatory Visit: Payer: Self-pay | Admitting: Family

## 2020-12-30 MED ORDER — MOUNJARO 5 MG/0.5ML ~~LOC~~ SOAJ
5.0000 mg | SUBCUTANEOUS | 0 refills | Status: DC
  Filled 2020-12-30: qty 2, 28d supply, fill #0

## 2020-12-30 MED ORDER — MOUNJARO 7.5 MG/0.5ML ~~LOC~~ SOAJ
7.5000 mg | SUBCUTANEOUS | 0 refills | Status: DC
  Filled 2020-12-30: qty 2, 28d supply, fill #0

## 2020-12-30 NOTE — Progress Notes (Signed)
[facility] Prescription sent to pharmacy. Encouraged exercise and healthy diet.

## 2020-12-31 ENCOUNTER — Other Ambulatory Visit (HOSPITAL_COMMUNITY): Payer: Self-pay

## 2021-01-03 ENCOUNTER — Telehealth: Payer: Self-pay | Admitting: *Deleted

## 2021-01-03 NOTE — Telephone Encounter (Signed)
Key: [id number] PA started on CMM Mounjaro 5MG /0.5ML pen-injectors  Sent to plan

## 2021-01-03 NOTE — Telephone Encounter (Signed)
Error message - Sent to [employer] Cannot find matching patient  Will confirm with pt - info is still correct.

## 2021-01-03 NOTE — Telephone Encounter (Signed)
Deniedtoday This request has not been approved. We were asked to cover the drug listed at the top of this letter under your pharmacy benefit. We denied this request based on our Pharmacy Benefit Formulary Exception Process. Your plan covers this drug when both of the following requirements are met: 1) Bailey Valdez is being used to treat a medical condition supported by the medical literature. 2) The step therapy requirements for [facility] have been met. Your provider requested this drug for weight loss or weight management, but it is not clinically supported for this use. Bailey Valdez is indicated for the treatment of type 2 diabetes mellitus (a chronic condition in which your body is resistant to insulin, a hormone that regulates your blood sugar).Your provider did not advise that this drug is being used for either of these approved medical reasons. In addition, our guideline named STANDARD STEP THERAPY requires that you have tried preferred options before receiving coverage for this drug. Your provider needs to tell us that you have tried the step therapies listed below, unless there is a valid medical reason why you cannot (a contraindication). Approval requires you to try one of the following: Victoza, Ozempic, Rybelsus, Byetta, Bydureon, Bydureon Bcise or Trulicity. Please talk with your provider about other treatment options or get [place] more information if it will allow [place] to approve this request. A written notification letter will follow with additional details.  FYI - pt will have to use coupon card for coverage.

## 2021-02-01 ENCOUNTER — Other Ambulatory Visit: Payer: Self-pay | Admitting: Family

## 2021-02-01 MED ORDER — MOUNJARO 5 MG/0.5ML ~~LOC~~ SOAJ: 5.0000 mg | SUBCUTANEOUS | 2 refills | Status: DC

## 2021-02-01 NOTE — Progress Notes (Signed)
Pt requesting refill Mounjaro. Prescription sent to pharmacy.

## 2021-02-03 ENCOUNTER — Other Ambulatory Visit (HOSPITAL_COMMUNITY): Payer: Self-pay

## 2021-02-03 ENCOUNTER — Other Ambulatory Visit: Payer: Self-pay | Admitting: Family

## 2021-02-03 MED ORDER — MOUNJARO 5 MG/0.5ML ~~LOC~~ SOAJ
5.0000 mg | SUBCUTANEOUS | 3 refills | Status: DC
  Filled 2021-02-03: qty 2, 28d supply, fill #0
  Filled 2021-03-03: qty 2, 28d supply, fill #1

## 2021-02-04 ENCOUNTER — Other Ambulatory Visit (HOSPITAL_COMMUNITY): Payer: Self-pay

## 2021-03-04 ENCOUNTER — Other Ambulatory Visit (HOSPITAL_COMMUNITY): Payer: Self-pay

## 2021-03-11 ENCOUNTER — Telehealth: Payer: 59 | Admitting: Physician Assistant

## 2021-03-11 DIAGNOSIS — R3989 Other symptoms and signs involving the genitourinary system: Secondary | ICD-10-CM | POA: Diagnosis not present

## 2021-03-11 MED ORDER — NITROFURANTOIN MONOHYD MACRO 100 MG PO CAPS: 100.0000 mg | ORAL_CAPSULE | Freq: Two times a day (BID) | ORAL | 0 refills | Status: DC

## 2021-03-11 NOTE — Progress Notes (Signed)

## 2021-04-05 ENCOUNTER — Other Ambulatory Visit (HOSPITAL_COMMUNITY): Payer: Self-pay

## 2021-04-05 ENCOUNTER — Other Ambulatory Visit: Payer: Self-pay | Admitting: Family

## 2021-04-05 MED ORDER — MOUNJARO 7.5 MG/0.5ML ~~LOC~~ SOAJ
7.5000 mg | SUBCUTANEOUS | 1 refills | Status: DC
  Filled 2021-04-05: qty 2, 28d supply, fill #0
  Filled 2021-05-03: qty 2, 28d supply, fill #1

## 2021-04-05 NOTE — Progress Notes (Signed)
Mounjaro increased to 7.5 mg and sent to pharmacy.

## 2021-05-02 IMAGING — MG MM DIGITAL SCREENING BILAT W/ TOMO AND CAD
8 series · 8 of 24 positions shown · non-contrast
Comparison: Previous exam(s).

CLINICAL DATA: Screening.

EXAM:
DIGITAL SCREENING BILATERAL MAMMOGRAM WITH TOMOSYNTHESIS AND CAD
TECHNIQUE: Bilateral screening digital craniocaudal and mediolateral oblique
mammograms were obtained. Bilateral screening digital breast
tomosynthesis was performed. The images were evaluated with
computer-aided detection.

[R CC synth-2D]
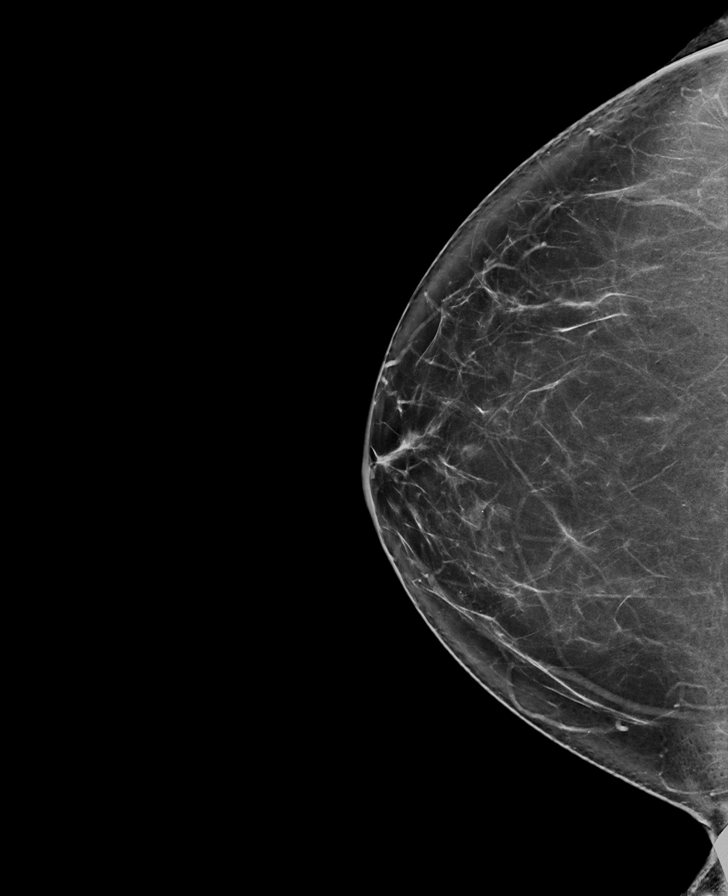

[R MLO synth-2D]
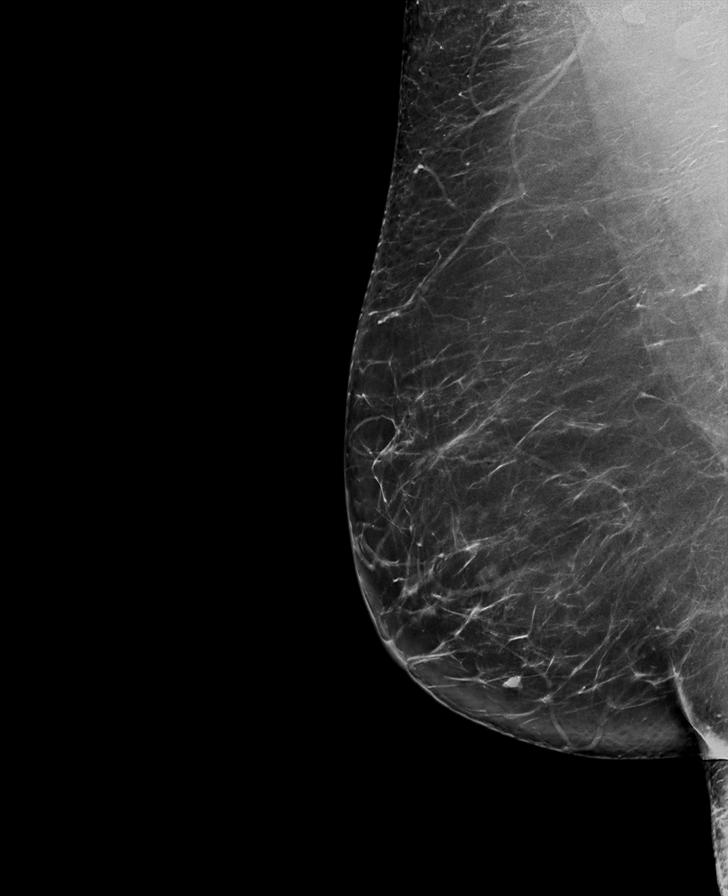

[L MLO synth-2D]
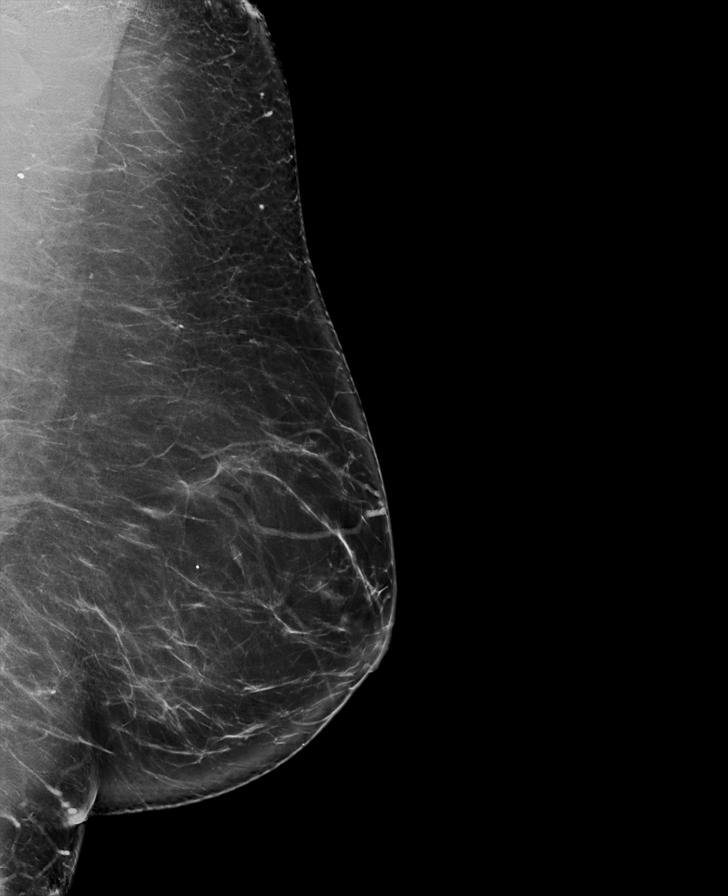

[L CC synth-2D]
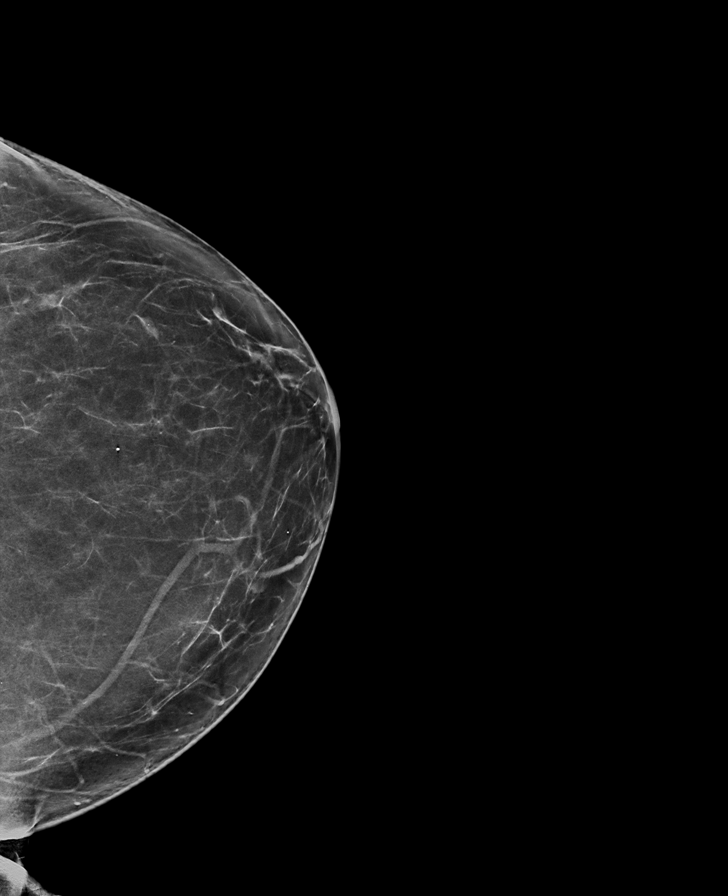

[R MLO tomo · tomo slice 46/91.0]
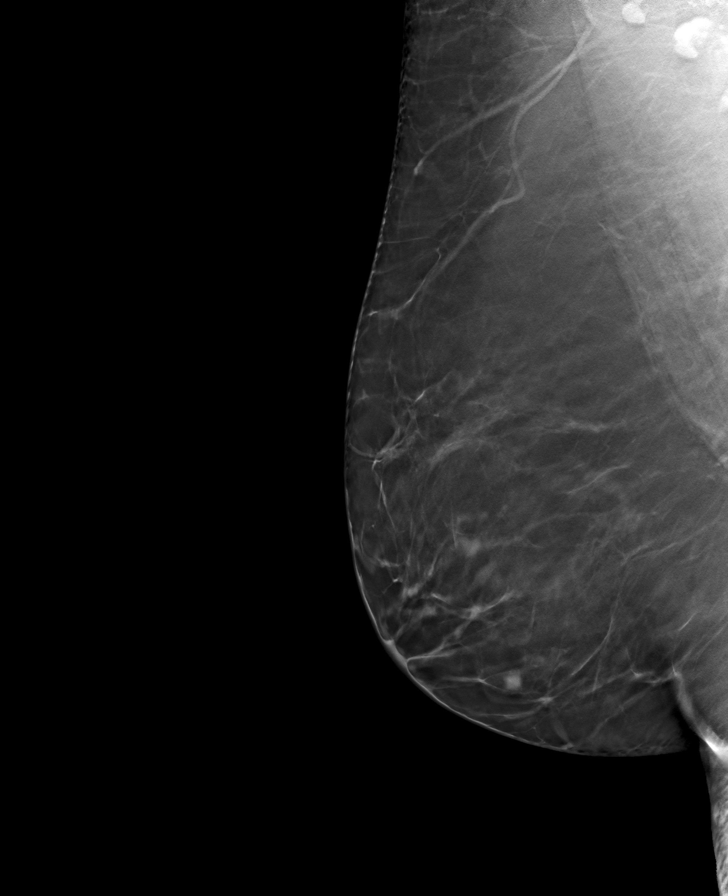

[R CC tomo · tomo slice 43/85.0]
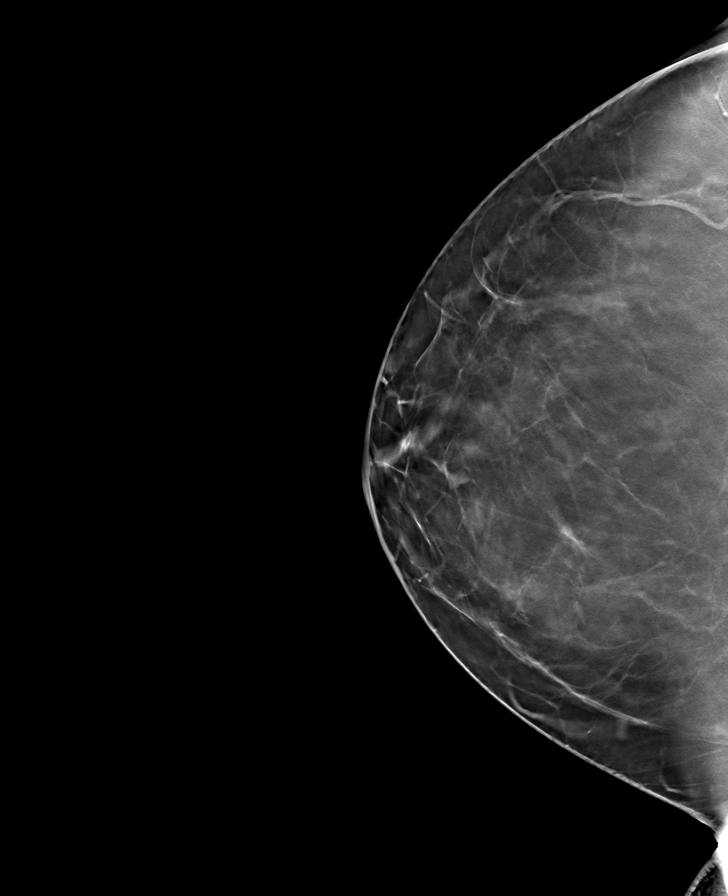

[L MLO tomo · tomo slice 47/92.0]
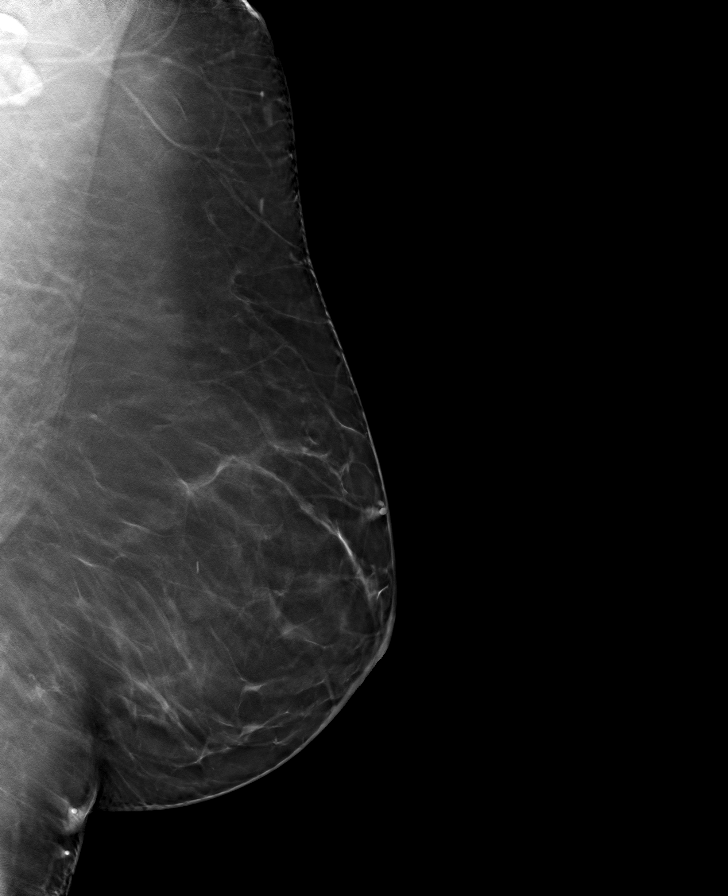

[L CC tomo · tomo slice 39/78.0]
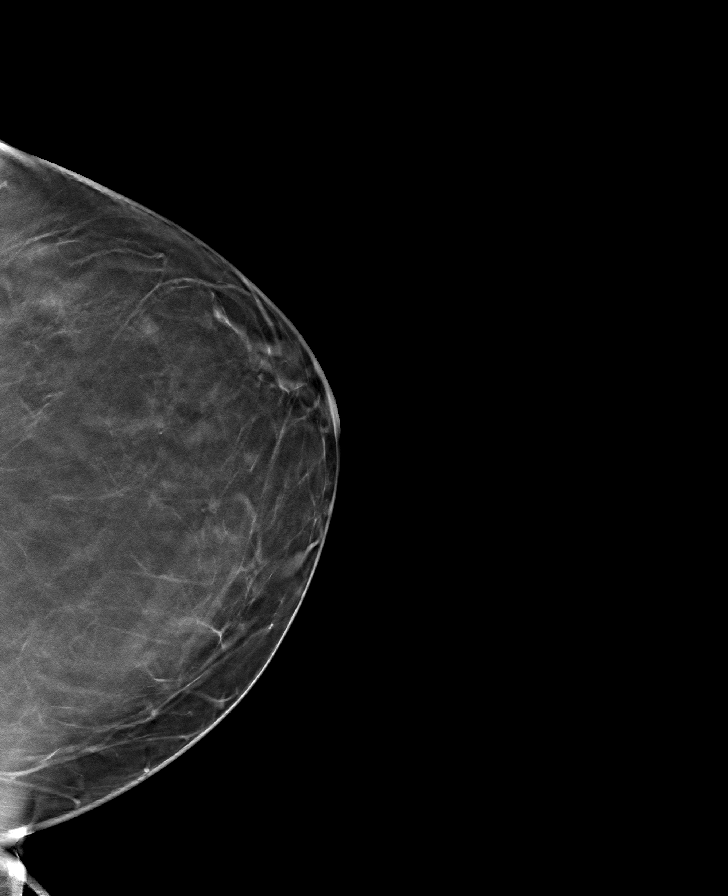

[8 of 24 positions shown; findings below may reference images not displayed]

ACR Breast Density Category b: There are scattered areas of
fibroglandular density.
FINDINGS: There are no findings suspicious for malignancy.
IMPRESSION: No mammographic evidence of malignancy. A result letter of this
screening mammogram will be mailed directly to the patient.

RECOMMENDATION:
Screening mammogram in one year. (Code:51-O-LD2)

BI-RADS CATEGORY  1: Negative.

## 2021-05-03 ENCOUNTER — Other Ambulatory Visit (HOSPITAL_COMMUNITY): Payer: Self-pay

## 2021-05-05 ENCOUNTER — Other Ambulatory Visit (HOSPITAL_COMMUNITY): Payer: Self-pay

## 2021-05-10 ENCOUNTER — Ambulatory Visit (INDEPENDENT_AMBULATORY_CARE_PROVIDER_SITE_OTHER): Payer: 59 | Admitting: Family

## 2021-05-10 ENCOUNTER — Encounter: Payer: Self-pay | Admitting: Family

## 2021-05-10 DIAGNOSIS — M7712 Lateral epicondylitis, left elbow: Secondary | ICD-10-CM

## 2021-05-10 NOTE — Progress Notes (Signed)
° °  Subjective:    Patient ID: Bailey Valdez, female    DOB: Apr 15, 1966, 55 y.o.   MRN: [id number]  No chief complaint on file.  Pt presents to the office today with left lateral elbow pain that started two weeks ago. She reports it hurts to pick up items and and grip things. She reports her pain is a 7 out 10. She has taken motrin and Voltaren gel with mild relief.  Arm Pain  The incident occurred more than 1 week ago. There was no injury mechanism. The pain is present in the left elbow. The quality of the pain is described as aching. The pain is at a severity of 7/10. The pain is mild. The pain has been Intermittent since the incident. Associated symptoms include tingling. Pertinent negatives include no numbness. Nothing aggravates the symptoms. She has tried NSAIDs and rest for the symptoms. The treatment provided mild relief.     Review of Systems  Neurological:  Positive for tingling. Negative for numbness.  All other systems reviewed and are negative.     Objective:   Physical Exam Vitals reviewed.  Constitutional:      General: She is not in acute distress.    Appearance: She is well-developed.  HENT:     Head: Normocephalic and atraumatic.  Eyes:     Pupils: Pupils are equal, round, and reactive to light.  Neck:     Thyroid: No thyromegaly.  Cardiovascular:     Rate and Rhythm: Normal rate and regular rhythm.     Heart sounds: Normal heart sounds. No murmur heard. Pulmonary:     Effort: Pulmonary effort is normal. No respiratory distress.     Breath sounds: Normal breath sounds. No wheezing.  Abdominal:     General: Bowel sounds are normal. There is no distension.     Palpations: Abdomen is soft.     Tenderness: There is no abdominal tenderness.  Musculoskeletal:        General: No tenderness. Normal range of motion.     Cervical back: Normal range of motion and neck supple.     Comments: Full ROM of left arm. Pain in lateral elbow with flexion and extension  Skin:     General: Skin is warm and dry.  Neurological:     Mental Status: She is alert and oriented to person, place, and time.     Cranial Nerves: No cranial nerve deficit.     Deep Tendon Reflexes: Reflexes are normal and symmetric.  Psychiatric:        Behavior: Behavior normal.        Thought Content: Thought content normal.        Judgment: Judgment normal.        Assessment & Plan:   DESIREY KEAHEY comes in today with chief complaint of Arm Pain   Diagnosis and orders addressed:  1. Lateral epicondylitis of left elbow Elbow brace placed Continue NSAID's and Voltaren gel  Rest Follow up if symptoms worsen or do not improve   Jannifer Rodney, FNP

## 2021-05-10 NOTE — Patient Instructions (Signed)
Tennis Elbow °Tennis elbow (lateral epicondylitis) is inflammation of tendons in your outer forearm, near your elbow. Tendons are tissues that connect muscle to bone. When you have tennis elbow, inflammation affects the tendons that you use to bend your wrist and move your hand up. Inflammation occurs in the lower part of the upper arm bone (humerus), where the tendons connect to the bone (lateral epicondyle). °Tennis elbow often affects people who play tennis, but anyone may get the condition from repeatedly extending the wrist or turning the forearm. °What are the causes? °This condition is usually caused by repeatedly extending the wrist, turning the forearm, and using the hands. It can result from sports or work that requires repetitive forearm movements. In some cases, it may be caused by a sudden injury. °What increases the risk? °You are more likely to develop tennis elbow if you play tennis or another racket sport. You also have a higher risk if you frequently use your hands for work. Besides people who play tennis, others at greater risk include: °People who use computers. °Construction workers. °People who work in factories. °Musicians. °Cooks. °Cashiers. °What are the signs or symptoms? °Symptoms of this condition include: °Pain and tenderness in the forearm and the outer part of the elbow. Pain may be felt only when using the arm, or it may be there all the time. °A burning feeling that starts in the elbow and spreads down the forearm. °A weak grip in the hand. °How is this diagnosed? °This condition is diagnosed based on your symptoms, your medical history, and a physical exam. °You may also have X-rays or an MRI to: °Confirm the diagnosis. °Look for other issues. °Check for tears in the ligaments, muscles, or tendons. °How is this treated? °Resting and icing your arm is often the first treatment. Your health care provider may also recommend: °Medicines to reduce pain and inflammation. These may be in  the form of a pill, topical gels, or shots of a steroid medicine (cortisone). °An elbow strap to reduce stress on the area. °Physical therapy. This may include massage or exercises or both. °An elbow brace to restrict the movements that cause symptoms. °If these treatments do not help relieve your symptoms, your health care provider may recommend surgery to remove damaged muscle and reattach healthy muscle to bone. °Follow these instructions at home: °If you have a brace or strap: °Wear the brace or strap as told by your health care provider. Remove it only as told by your health care provider. °Check the skin around the brace or strap every day. Tell your health care provider about any concerns. °Loosen the brace if your fingers tingle, become numb, or turn cold and blue. °Keep the brace clean. °If the brace or strap is not waterproof: °Do not let it get wet. °Cover it with a watertight covering when you take a bath or a shower. °Managing pain, stiffness, and swelling ° °If directed, put ice on the injured area. To do this: °If you have a removable brace or strap, remove it as told by your health care provider. °Put ice in a plastic bag. °Place a towel between your skin and the bag. °Leave the ice on for 20 minutes, 2-3 times a day. °Remove the ice if your skin turns bright red. This is very important. If you cannot feel pain, heat, or cold, you have a greater risk of damage to the area. °Move your fingers often to reduce stiffness and swelling. °Activity °Rest your elbow and   wrist and avoid activities that cause symptoms as told by your health care provider. °Do physical therapy exercises as told by your health care provider. °If you lift an object, lift it with your palm facing up. This reduces stress on your elbow. °Lifestyle °If your tennis elbow is caused by sports, check your equipment and make sure that: °You use it correctly. °It is good match for you. °If your tennis elbow is caused by work or computer  use, take frequent breaks to stretch your arm. Talk with your employer about ways to manage your condition at work. °General instructions °Take over-the-counter and prescription medicines only as told by your health care provider. °Do not use any products that contain nicotine or tobacco. These products include cigarettes, chewing tobacco, and vaping devices, such as e-cigarettes. If you need help quitting, ask your health care provider. °Keep all follow-up visits. This is important. °How is this prevented? °Before and after activity: °Warm up and stretch before being active. °Cool down and stretch after being active. °Give your body time to rest between periods of activity. °During activity: °Make sure to use equipment that fits you. °If you play tennis, put power in your stroke with your lower body. Avoid using your arm only. °Maintain physical fitness, including: °Strength. °Flexibility. °Endurance. °Do exercises to strengthen the forearm muscles. °Contact a health care provider if: °You have pain that gets worse or does not get better with treatment. °You have numbness or weakness in your forearm, hand, or fingers. °Get help right away if: °Your pain is severe. °You cannot move your wrist. °Summary °Tennis elbow (lateral epicondylitis) is inflammation of tendons in your outer forearm, near your elbow. °Common symptoms include pain and tenderness in your forearm and the outer part of your elbow. °This condition is usually caused by repeatedly extending your wrist, turning your forearm, and using your hands. °The first treatment is often resting and icing your arm to relieve symptoms. Further treatment may include taking medicine, getting physical therapy, wearing a brace or strap, or having surgery. °This information is not intended to replace advice given to you by your health care provider. Make sure you discuss any questions you have with your health care provider. °Document Revised: 11/11/2019 Document  Reviewed: 11/11/2019 °Elsevier Patient Education © 2022 Elsevier Inc. ° °

## 2021-06-02 ENCOUNTER — Other Ambulatory Visit (HOSPITAL_COMMUNITY): Payer: Self-pay

## 2021-06-02 ENCOUNTER — Other Ambulatory Visit: Payer: Self-pay | Admitting: Family

## 2021-06-02 MED ORDER — MOUNJARO 7.5 MG/0.5ML ~~LOC~~ SOAJ
7.5000 mg | SUBCUTANEOUS | 3 refills | Status: DC
  Filled 2021-06-02: qty 2, 28d supply, fill #0

## 2021-06-02 NOTE — Progress Notes (Signed)
Pt requesting refill of Mounjaro 7.5 mg. Prescription sent to pharmacy.   Jannifer Rodney, FNP

## 2021-06-21 ENCOUNTER — Other Ambulatory Visit: Payer: Self-pay | Admitting: Family

## 2021-06-21 ENCOUNTER — Other Ambulatory Visit (HOSPITAL_COMMUNITY): Payer: Self-pay

## 2021-06-21 MED ORDER — MOUNJARO 10 MG/0.5ML ~~LOC~~ SOAJ
10.0000 mg | SUBCUTANEOUS | 2 refills | Status: DC
  Filled 2021-06-21: qty 2, 28d supply, fill #0

## 2021-06-21 NOTE — Progress Notes (Signed)
Pt calls and requesting to increase Mounjaro to 10 mg from 7.5 mg.

## 2021-06-27 ENCOUNTER — Other Ambulatory Visit (HOSPITAL_COMMUNITY): Payer: Self-pay

## 2021-06-27 ENCOUNTER — Other Ambulatory Visit: Payer: Self-pay | Admitting: Family

## 2021-06-27 MED ORDER — MOUNJARO 12.5 MG/0.5ML ~~LOC~~ SOAJ
12.5000 mg | SUBCUTANEOUS | 2 refills | Status: DC
  Filled 2021-06-27: qty 2, 28d supply, fill #0
  Filled 2021-07-26: qty 2, 28d supply, fill #1

## 2021-06-27 NOTE — Progress Notes (Signed)
Pharmacy out of Mounjaro 10 mg, will send in Mounjaro 12.5 mg

## 2021-07-26 ENCOUNTER — Other Ambulatory Visit (HOSPITAL_COMMUNITY): Payer: Self-pay

## 2021-07-29 ENCOUNTER — Other Ambulatory Visit (HOSPITAL_COMMUNITY): Payer: Self-pay

## 2021-08-04 ENCOUNTER — Other Ambulatory Visit (HOSPITAL_COMMUNITY): Payer: Self-pay

## 2021-08-15 ENCOUNTER — Encounter: Payer: 59 | Admitting: Family

## 2021-08-15 NOTE — Progress Notes (Signed)
Error

## 2021-08-18 ENCOUNTER — Ambulatory Visit (INDEPENDENT_AMBULATORY_CARE_PROVIDER_SITE_OTHER): Payer: 59 | Admitting: Family

## 2021-08-18 ENCOUNTER — Encounter: Payer: Self-pay | Admitting: Family

## 2021-08-18 DIAGNOSIS — R300 Dysuria: Secondary | ICD-10-CM | POA: Diagnosis not present

## 2021-08-18 DIAGNOSIS — R399 Unspecified symptoms and signs involving the genitourinary system: Secondary | ICD-10-CM

## 2021-08-18 LAB — URINALYSIS, COMPLETE
Bilirubin, UA: NEGATIVE
Glucose, UA: NEGATIVE
Nitrite, UA: NEGATIVE
RBC, UA: NEGATIVE
Specific Gravity, UA: 1.025 (ref 1.005–1.030)
Urobilinogen, Ur: 0.2 mg/dL (ref 0.2–1.0)
pH, UA: 6 (ref 5.0–7.5)

## 2021-08-18 LAB — MICROSCOPIC EXAMINATION
RBC, Urine: NONE SEEN /hpf (ref 0–2)
Renal Epithel, UA: NONE SEEN /hpf

## 2021-08-18 MED ORDER — DOXYCYCLINE HYCLATE 100 MG PO TABS: 100.0000 mg | ORAL_TABLET | Freq: Two times a day (BID) | ORAL | 0 refills | Status: DC

## 2021-08-18 MED ORDER — CEFTRIAXONE SODIUM 1 G IJ SOLR
1.0000 g | Freq: Once | INTRAMUSCULAR | Status: AC
  Administered 2021-08-18: 1 g via INTRAMUSCULAR

## 2021-08-18 NOTE — Patient Instructions (Signed)
Urinary Tract Infection, Adult A urinary tract infection (UTI) is an infection of any part of the urinary tract. The urinary tract includes the kidneys, ureters, bladder, and urethra. These organs make, store, and get rid of urine in the body. An upper UTI affects the ureters and kidneys. A lower UTI affects the bladder and urethra. What are the causes? Most urinary tract infections are caused by bacteria in your genital area around your urethra, where urine leaves your body. These bacteria grow and cause inflammation of your urinary tract. What increases the risk? You are more likely to develop this condition if: You have a urinary catheter that stays in place. You are not able to control when you urinate or have a bowel movement (incontinence). You are female and you: Use a spermicide or diaphragm for birth control. Have low estrogen levels. Are pregnant. You have certain genes that increase your risk. You are sexually active. You take antibiotic medicines. You have a condition that causes your flow of urine to slow down, such as: An enlarged prostate, if you are female. Blockage in your urethra. A kidney stone. A nerve condition that affects your bladder control (neurogenic bladder). Not getting enough to drink, or not urinating often. You have certain medical conditions, such as: Diabetes. A weak disease-fighting system (immunesystem). Sickle cell disease. Gout. Spinal cord injury. What are the signs or symptoms? Symptoms of this condition include: Needing to urinate right away (urgency). Frequent urination. This may include small amounts of urine each time you urinate. Pain or burning with urination. Blood in the urine. Urine that smells bad or unusual. Trouble urinating. Cloudy urine. Vaginal discharge, if you are female. Pain in the abdomen or the lower back. You may also have: Vomiting or a decreased appetite. Confusion. Irritability or tiredness. A fever or  chills. Diarrhea. The first symptom in older adults may be confusion. In some cases, they may not have any symptoms until the infection has worsened. How is this diagnosed? This condition is diagnosed based on your medical history and a physical exam. You may also have other tests, including: Urine tests. Blood tests. Tests for STIs (sexually transmitted infections). If you have had more than one UTI, a cystoscopy or imaging studies may be done to determine the cause of the infections. How is this treated? Treatment for this condition includes: Antibiotic medicine. Over-the-counter medicines to treat discomfort. Drinking enough water to stay hydrated. If you have frequent infections or have other conditions such as a kidney stone, you may need to see a health care provider who specializes in the urinary tract (urologist). In rare cases, urinary tract infections can cause sepsis. Sepsis is a life-threatening condition that occurs when the body responds to an infection. Sepsis is treated in the hospital with IV antibiotics, fluids, and other medicines. Follow these instructions at home: Medicines Take over-the-counter and prescription medicines only as told by your health care provider. If you were prescribed an antibiotic medicine, take it as told by your health care provider. Do not stop using the antibiotic even if you start to feel better. General instructions Make sure you: Empty your bladder often and completely. Do not hold urine for long periods of time. Empty your bladder after sex. Wipe from front to back after urinating or having a bowel movement if you are female. Use each tissue only one time when you wipe. Drink enough fluid to keep your urine pale yellow. Keep all follow-up visits. This is important. Contact a health care provider   if: Your symptoms do not get better after 1-2 days. Your symptoms go away and then return. Get help right away if: You have severe pain in your  back or your lower abdomen. You have a fever or chills. You have nausea or vomiting. Summary A urinary tract infection (UTI) is an infection of any part of the urinary tract, which includes the kidneys, ureters, bladder, and urethra. Most urinary tract infections are caused by bacteria in your genital area. Treatment for this condition often includes antibiotic medicines. If you were prescribed an antibiotic medicine, take it as told by your health care provider. Do not stop using the antibiotic even if you start to feel better. Keep all follow-up visits. This is important. This information is not intended to replace advice given to you by your health care provider. Make sure you discuss any questions you have with your health care provider. Document Revised: 12/12/2019 Document Reviewed: 12/12/2019 Elsevier Patient Education  2022 [employer]  

## 2021-08-18 NOTE — Progress Notes (Signed)
? ?  Subjective:  ? ? Patient ID: Bailey Valdez, female    DOB: 11/21/1965, 56 y.o.   MRN: [id number] ? ?No chief complaint on file. ? ?PT presents to the office today with recurrent UTI symptoms that started over a month ago. She has take Keflex, Macrobid, and clindamycin with mild relief. However, after several days symptoms return.  ?Dysuria  ?This is a new problem. The current episode started more than 1 month ago. The problem occurs intermittently. The problem has been waxing and waning. The quality of the pain is described as burning. The pain is at a severity of 8/10. There has been no fever. Associated symptoms include frequency, hesitancy and urgency. Pertinent negatives include no discharge, hematuria or nausea. She has tried antibiotics and increased fluids for the symptoms. The treatment provided mild relief.  ? ? ? ?Review of Systems  ?Gastrointestinal:  Negative for nausea.  ?Genitourinary:  Positive for dysuria, frequency, hesitancy and urgency. Negative for hematuria.  ?All other systems reviewed and are negative. ? ?   ?Objective:  ? Physical Exam ?Vitals reviewed.  ?Constitutional:   ?   General: She is not in acute distress. ?   Appearance: She is well-developed.  ?HENT:  ?   Head: Normocephalic and atraumatic.  ?Eyes:  ?   Pupils: Pupils are equal, round, and reactive to light.  ?Neck:  ?   Thyroid: No thyromegaly.  ?Cardiovascular:  ?   Rate and Rhythm: Normal rate and regular rhythm.  ?   Heart sounds: Normal heart sounds. No murmur heard. ?Pulmonary:  ?   Effort: Pulmonary effort is normal. No respiratory distress.  ?   Breath sounds: Normal breath sounds. No wheezing.  ?Abdominal:  ?   General: Bowel sounds are normal. There is no distension.  ?   Palpations: Abdomen is soft.  ?   Tenderness: There is no abdominal tenderness.  ?Musculoskeletal:     ?   General: No tenderness. Normal range of motion.  ?   Cervical back: Normal range of motion and neck supple.  ?Skin: ?   General: Skin is warm and  dry.  ?Neurological:  ?   Mental Status: She is alert and oriented to person, place, and time.  ?   Cranial Nerves: No cranial nerve deficit.  ?   Deep Tendon Reflexes: Reflexes are normal and symmetric.  ?Psychiatric:     ?   Behavior: Behavior normal.     ?   Thought Content: Thought content normal.     ?   Judgment: Judgment normal.  ? ? ? ?   ?Assessment & Plan:  ?Bailey Valdez comes in today with chief complaint of No chief complaint on file. ? ? ?Diagnosis and orders addressed: ? ?1. UTI symptoms ?Force fluids ?AZO over the counter X2 days ?RTO prn ?Culture pending ?- Urinalysis, Complete ?- Urine Culture ? ?Bailey Rodney, FNP ? ? ?

## 2021-08-22 LAB — URINE CULTURE

## 2021-08-23 ENCOUNTER — Other Ambulatory Visit: Payer: Self-pay | Admitting: Family Medicine

## 2021-08-23 DIAGNOSIS — N3090 Cystitis, unspecified without hematuria: Secondary | ICD-10-CM

## 2021-08-23 MED ORDER — CIPROFLOXACIN HCL 500 MG PO TABS: 500.0000 mg | ORAL_TABLET | Freq: Two times a day (BID) | ORAL | 0 refills | Status: AC

## 2021-08-30 ENCOUNTER — Other Ambulatory Visit (HOSPITAL_COMMUNITY): Payer: Self-pay

## 2021-08-30 ENCOUNTER — Telehealth: Payer: Self-pay | Admitting: Pharmacist

## 2021-08-30 MED ORDER — WEGOVY 1 MG/0.5ML ~~LOC~~ SOAJ
1.0000 mg | SUBCUTANEOUS | 3 refills | Status: DC
  Filled 2021-08-30: qty 2, 28d supply, fill #0

## 2021-08-30 NOTE — Telephone Encounter (Signed)
Switching patient currently on mounjaro to ozempic 1mg  once weekly due to insurance ?

## 2021-08-31 ENCOUNTER — Other Ambulatory Visit (HOSPITAL_COMMUNITY): Payer: Self-pay

## 2021-08-31 NOTE — Telephone Encounter (Signed)
Your prior authorization for Bailey Valdez has been approved! ?MORE INFO ?Personalized support and financial assistance may be available through the [facility] [employer] program. For more information, and to see program requirements, click on the More Info button to the right. ? ?Message from plan: The request has been approved. The authorization is effective for a maximum of 5 fills from 08/31/2021 to 01/28/2022, as long as the member is enrolled in their current health plan. The request was approved as submitted. The request was approved for 63mL per 28 days.Additional prior authorizations (PA) have been entered [id number] 0.25mg /0.34mL allowing a maximum of 5 fills with a quantity limit of 2mL per 28 days ([place] 16306)Wegovy 0.5mg /0.32mL allowing a maximum of 5 fills with a quantity limit of 12mL per 28 days ([place] 16307)Wegovy 1.7mg /0.68mL allowing a maximum of 5 fills with a quantity limit of 73mL per 28 days ([place] 16308)Wegovy 2.4mg /0.88mL allowing a maximum of 5 fills with a quantity limit of 84mL per 28 days ([place] 16309)These authorizations are effective 08/31/2021 through 01/28/2022. A written notification letter will follow with additional details. ? ? ?Pharm aware  ?

## 2021-09-26 ENCOUNTER — Other Ambulatory Visit: Payer: Self-pay | Admitting: Family

## 2021-09-26 ENCOUNTER — Other Ambulatory Visit (HOSPITAL_COMMUNITY): Payer: Self-pay

## 2021-09-26 MED ORDER — SEMAGLUTIDE-WEIGHT MANAGEMENT 1.7 MG/0.75ML ~~LOC~~ SOAJ
1.7000 mg | SUBCUTANEOUS | 2 refills | Status: DC
  Filled 2021-09-26: qty 3, 28d supply, fill #0
  Filled 2021-10-20: qty 3, 28d supply, fill #1

## 2021-09-26 NOTE — Progress Notes (Signed)
Wegovy increased to 1.7 mg from 1 mg.  

## 2021-10-11 ENCOUNTER — Telehealth: Payer: Self-pay | Admitting: *Deleted

## 2021-10-11 ENCOUNTER — Other Ambulatory Visit: Payer: Self-pay | Admitting: Family

## 2021-10-11 DIAGNOSIS — Z1231 Encounter for screening mammogram for malignant neoplasm of breast: Secondary | ICD-10-CM

## 2021-10-11 NOTE — Telephone Encounter (Signed)
Appt made

## 2021-10-11 NOTE — Telephone Encounter (Signed)
Pt is due for her mammogram and would like it scheduled.

## 2021-10-20 ENCOUNTER — Other Ambulatory Visit (HOSPITAL_COMMUNITY): Payer: Self-pay

## 2021-10-20 ENCOUNTER — Ambulatory Visit (INDEPENDENT_AMBULATORY_CARE_PROVIDER_SITE_OTHER): Payer: 59 | Admitting: Family

## 2021-10-20 ENCOUNTER — Encounter: Payer: Self-pay | Admitting: Family

## 2021-10-20 ENCOUNTER — Other Ambulatory Visit (HOSPITAL_COMMUNITY)
Admission: RE | Admit: 2021-10-20 | Discharge: 2021-10-20 | Disposition: A | Discharge status: Home / Self Care | Payer: 59 | Source: Ambulatory Visit | Attending: Family | Admitting: Family

## 2021-10-20 VITALS — BP 137/89 | HR 95 | Temp 97.4°F | Ht 63.0 in | Wt 183.6 lb

## 2021-10-20 DIAGNOSIS — Z23 Encounter for immunization: Secondary | ICD-10-CM | POA: Diagnosis not present

## 2021-10-20 DIAGNOSIS — F411 Generalized anxiety disorder: Secondary | ICD-10-CM

## 2021-10-20 DIAGNOSIS — E785 Hyperlipidemia, unspecified: Secondary | ICD-10-CM

## 2021-10-20 DIAGNOSIS — Z01419 Encounter for gynecological examination (general) (routine) without abnormal findings: Secondary | ICD-10-CM | POA: Insufficient documentation

## 2021-10-20 DIAGNOSIS — E559 Vitamin D deficiency, unspecified: Secondary | ICD-10-CM

## 2021-10-20 DIAGNOSIS — I10 Essential (primary) hypertension: Secondary | ICD-10-CM | POA: Diagnosis not present

## 2021-10-20 DIAGNOSIS — E669 Obesity, unspecified: Secondary | ICD-10-CM

## 2021-10-20 DIAGNOSIS — Z0000 Encounter for general adult medical examination without abnormal findings: Secondary | ICD-10-CM | POA: Insufficient documentation

## 2021-10-20 DIAGNOSIS — Z0001 Encounter for general adult medical examination with abnormal findings: Secondary | ICD-10-CM | POA: Diagnosis not present

## 2021-10-20 DIAGNOSIS — F321 Major depressive disorder, single episode, moderate: Secondary | ICD-10-CM | POA: Diagnosis not present

## 2021-10-20 MED ORDER — VILAZODONE HCL 40 MG PO TABS
ORAL_TABLET | Freq: Every day | ORAL | 4 refills | Status: DC
  Filled 2021-10-20: qty 90, 90d supply, fill #0
  Filled 2022-04-13: qty 30, 30d supply, fill #1
  Filled 2022-04-13: qty 60, 60d supply, fill #1
  Filled 2022-05-10: qty 90, 90d supply, fill #1

## 2021-10-20 NOTE — Addendum Note (Signed)
Addended by: Jannifer Rodney A on: 10/20/2021 01:54 PM   Modules accepted: Orders

## 2021-10-20 NOTE — Progress Notes (Addendum)
Subjective:    Patient ID: Bailey Valdez, female    DOB: 05-Oct-1965, 56 y.o.   MRN: [id number]  Chief Complaint  Patient presents with   Annual Exam    CPE   Pt presents to the office today for CPE with pap.   Hypertension This is a chronic problem. The current episode started more than 1 year ago. The problem has been resolved since onset. The problem is controlled. Associated symptoms include anxiety. Pertinent negatives include no malaise/fatigue, peripheral edema or shortness of breath. Risk factors for coronary artery disease include dyslipidemia. The current treatment provides mild improvement.  Hyperlipidemia This is a chronic problem. The current episode started more than 1 year ago. The problem is uncontrolled. Exacerbating diseases include obesity. Pertinent negatives include no shortness of breath. Current antihyperlipidemic treatment includes diet change. The current treatment provides mild improvement of lipids. Risk factors for coronary artery disease include dyslipidemia, hypertension and a sedentary lifestyle.  Anxiety Presents for follow-up visit. Symptoms include excessive worry, irritability and nervous/anxious behavior. Patient reports no shortness of breath. Symptoms occur occasionally. The severity of symptoms is moderate.    Depression        This is a chronic problem.  The onset quality is gradual.   The problem occurs intermittently.  Associated symptoms include no helplessness, no hopelessness and not irritable.  Past treatments include SNRIs - Serotonin and norepinephrine reuptake inhibitors.  Compliance with treatment is good.  Past medical history includes anxiety.       Review of Systems  Constitutional:  Positive for irritability. Negative for malaise/fatigue.  Respiratory:  Negative for shortness of breath.   Psychiatric/Behavioral:  Positive for depression. The patient is nervous/anxious.   All other systems reviewed and are negative.  Family History   Problem Relation Age of Onset   Crohn's disease Mother    Heart disease Father    Colon cancer Maternal Uncle    Crohn's disease Maternal Uncle    Breast cancer Maternal Grandmother    Social History   Socioeconomic History   Marital status: Married    Spouse name: Not on file   Number of children: Not on file   Years of education: Not on file   Highest education level: Not on file  Occupational History   Not on file  Tobacco Use   Smoking status: Never   Smokeless tobacco: Never  Vaping Use   Vaping Use: Never used  Substance and Sexual Activity   Alcohol use: No    Alcohol/week: 0.0 standard drinks of alcohol   Drug use: No   Sexual activity: Not on file  Other Topics Concern   Not on file  Social History Narrative   Not on file   Social Determinants of Health   Financial Resource Strain: Not on file  Food Insecurity: Not on file  Transportation Needs: Not on file  Physical Activity: Not on file  Stress: Not on file  Social Connections: Not on file       Objective:   Physical Exam Vitals reviewed.  Constitutional:      General: She is not irritable.She is not in acute distress.    Appearance: She is well-developed. She is obese.  HENT:     Head: Normocephalic and atraumatic.     Right Ear: Tympanic membrane normal.     Left Ear: Tympanic membrane normal.  Eyes:     Pupils: Pupils are equal, round, and reactive to light.  Neck:  Thyroid: No thyromegaly.  Cardiovascular:     Rate and Rhythm: Normal rate and regular rhythm.     Heart sounds: Normal heart sounds. No murmur heard. Pulmonary:     Effort: Pulmonary effort is normal. No respiratory distress.     Breath sounds: Normal breath sounds. No wheezing.  Chest:  Breasts:    Right: No swelling, bleeding, inverted nipple, mass, nipple discharge, skin change or tenderness.     Left: No swelling, bleeding, inverted nipple, mass, nipple discharge, skin change or tenderness.  Abdominal:      General: Bowel sounds are normal. There is no distension.     Palpations: Abdomen is soft.     Tenderness: There is no abdominal tenderness.  Genitourinary:    Comments: Bimanual exam- no adnexal masses or tenderness, ovaries nonpalpable   Cervix parous and pink- No discharge  Musculoskeletal:        General: No tenderness. Normal range of motion.     Cervical back: Normal range of motion and neck supple.  Skin:    General: Skin is warm and dry.  Neurological:     Mental Status: She is alert and oriented to person, place, and time.     Cranial Nerves: No cranial nerve deficit.     Deep Tendon Reflexes: Reflexes are normal and symmetric.  Psychiatric:        Behavior: Behavior normal.        Thought Content: Thought content normal.        Judgment: Judgment normal.       BP 137/89   Pulse 95   Temp (!) 97.4 F (36.3 C) (Temporal)   Ht 5' 3"  (1.6 m)   Wt 183 lb 9.6 oz (83.3 kg)   BMI 32.52 kg/m      Assessment & Plan:  Bailey Valdez comes in today with chief complaint of Annual Exam (CPE)   Diagnosis and orders addressed: 1. Annual physical exam - CMP14+EGFR - CBC with Differential/Platelet - Lipid panel - TSH - VITAMIN D 25 Hydroxy (Vit-D Deficiency, Fractures) - Cytology - PAP([place])  2. Vitamin D deficiency - CMP14+EGFR - CBC with Differential/Platelet - VITAMIN D 25 Hydroxy (Vit-D Deficiency, Fractures)  3. Primary hypertension  4. Obesity (BMI 30-39.9)  5. Hyperlipidemia, unspecified hyperlipidemia type  6. GAD (generalized anxiety disorder) - Vilazodone HCl (VIIBRYD) 40 MG TABS; TAKE 1/2 TO 1 TABLET BY MOUTH DAILY  Dispense: 90 tablet; Refill: 4  7. Depression, major, single episode, moderate (HCC) - Vilazodone HCl (VIIBRYD) 40 MG TABS; TAKE 1/2 TO 1 TABLET BY MOUTH DAILY  Dispense: 90 tablet; Refill: 4  8. Gynecologic exam normal - Cytology - PAP([place])   Labs pending Health Maintenance reviewed Diet and exercise  encouraged  Follow up plan: 6 months   Evelina Dun, FNP

## 2021-10-20 NOTE — Patient Instructions (Signed)

## 2021-10-20 NOTE — Addendum Note (Signed)
Addended by: Cleda Daub on: 10/20/2021 04:22 PM   Modules accepted: Orders

## 2021-10-21 LAB — CMP14+EGFR
ALT: 22 IU/L (ref 0–32)
AST: 21 IU/L (ref 0–40)
Albumin/Globulin Ratio: 1.8 (ref 1.2–2.2)
Albumin: 4.3 g/dL (ref 3.8–4.9)
Alkaline Phosphatase: 71 IU/L (ref 44–121)
BUN/Creatinine Ratio: 12 (ref 9–23)
BUN: 10 mg/dL (ref 6–24)
Bilirubin Total: 0.5 mg/dL (ref 0.0–1.2)
CO2: 24 mmol/L (ref 20–29)
Calcium: 9.2 mg/dL (ref 8.7–10.2)
Chloride: 104 mmol/L (ref 96–106)
Creatinine, Ser: 0.83 mg/dL (ref 0.57–1.00)
Globulin, Total: 2.4 g/dL (ref 1.5–4.5)
Glucose: 89 mg/dL (ref 70–99)
Potassium: 4.2 mmol/L (ref 3.5–5.2)
Sodium: 141 mmol/L (ref 134–144)
Total Protein: 6.7 g/dL (ref 6.0–8.5)
eGFR: 82 mL/min/{1.73_m2} (ref >59)

## 2021-10-21 LAB — LIPID PANEL
Chol/HDL Ratio: 3.8 ratio (ref 0.0–4.4)
Cholesterol, Total: 160 mg/dL (ref 100–199)
HDL: 42 mg/dL (ref >39)
LDL Chol Calc (NIH): 101 mg/dL — ABNORMAL HIGH (ref 0–99)
Triglycerides: 89 mg/dL (ref 0–149)
VLDL Cholesterol Cal: 17 mg/dL (ref 5–40)

## 2021-10-21 LAB — CBC WITH DIFFERENTIAL/PLATELET
Basophils Absolute: 0 10*3/uL (ref 0.0–0.2)
Basos: 1 %
EOS (ABSOLUTE): 0.2 10*3/uL (ref 0.0–0.4)
Eos: 3 %
Hematocrit: 44.5 % (ref 34.0–46.6)
Hemoglobin: 14.9 g/dL (ref 11.1–15.9)
Immature Grans (Abs): 0 10*3/uL (ref 0.0–0.1)
Immature Granulocytes: 0 %
Lymphocytes Absolute: 1.6 10*3/uL (ref 0.7–3.1)
Lymphs: 27 %
MCH: 29.4 pg (ref 26.6–33.0)
MCHC: 33.5 g/dL (ref 31.5–35.7)
MCV: 88 fL (ref 79–97)
Monocytes Absolute: 0.6 10*3/uL (ref 0.1–0.9)
Monocytes: 10 %
Neutrophils Absolute: 3.5 10*3/uL (ref 1.4–7.0)
Neutrophils: 59 %
Platelets: 246 10*3/uL (ref 150–450)
RBC: 5.06 x10E6/uL (ref 3.77–5.28)
RDW: 13.1 % (ref 11.7–15.4)
WBC: 5.9 10*3/uL (ref 3.4–10.8)

## 2021-10-21 LAB — TSH: TSH: 3.21 u[IU]/mL (ref 0.450–4.500)

## 2021-10-21 LAB — VITAMIN D 25 HYDROXY (VIT D DEFICIENCY, FRACTURES): Vit D, 25-Hydroxy: 26.5 ng/mL — ABNORMAL LOW (ref 30.0–100.0)

## 2021-10-25 LAB — CYTOLOGY - PAP
Adequacy: ABSENT
Diagnosis: NEGATIVE

## 2021-11-07 ENCOUNTER — Ambulatory Visit
Admission: RE | Admit: 2021-11-07 | Discharge: 2021-11-07 | Disposition: A | Discharge status: Home / Self Care | Payer: 59 | Source: Ambulatory Visit | Attending: Family | Admitting: Family

## 2021-11-07 DIAGNOSIS — Z1231 Encounter for screening mammogram for malignant neoplasm of breast: Secondary | ICD-10-CM

## 2021-11-17 ENCOUNTER — Other Ambulatory Visit (HOSPITAL_COMMUNITY): Payer: Self-pay

## 2021-11-17 ENCOUNTER — Other Ambulatory Visit: Payer: Self-pay | Admitting: Family

## 2021-11-17 ENCOUNTER — Encounter (HOSPITAL_COMMUNITY): Payer: Self-pay

## 2021-11-17 MED ORDER — WEGOVY 2.4 MG/0.75ML ~~LOC~~ SOAJ
2.4000 mg | SUBCUTANEOUS | 2 refills | Status: DC
  Filled 2021-11-17: qty 3, 28d supply, fill #0
  Filled 2021-12-12: qty 3, 28d supply, fill #1
  Filled 2022-01-07: qty 3, 28d supply, fill #2

## 2021-11-17 NOTE — Progress Notes (Signed)
Will increase Wegovy to 2.4 mg from 1.7. Her current weight is 181.8 lbs and have lost 41 lbs.   Jannifer Rodney, FNP

## 2021-12-12 ENCOUNTER — Other Ambulatory Visit (HOSPITAL_COMMUNITY): Payer: Self-pay

## 2022-01-07 ENCOUNTER — Other Ambulatory Visit (HOSPITAL_COMMUNITY): Payer: Self-pay

## 2022-02-10 ENCOUNTER — Other Ambulatory Visit (HOSPITAL_COMMUNITY): Payer: Self-pay

## 2022-02-10 ENCOUNTER — Other Ambulatory Visit: Payer: Self-pay | Admitting: Family

## 2022-02-10 MED ORDER — WEGOVY 2.4 MG/0.75ML ~~LOC~~ SOAJ
2.4000 mg | SUBCUTANEOUS | 2 refills | Status: DC
  Filled 2022-02-10 – 2022-02-17 (×4): qty 3, 28d supply, fill #0

## 2022-02-10 NOTE — Progress Notes (Signed)
Refill [id number] Prescription sent to pharmacy

## 2022-02-15 ENCOUNTER — Other Ambulatory Visit (HOSPITAL_COMMUNITY): Payer: Self-pay

## 2022-02-17 ENCOUNTER — Other Ambulatory Visit (HOSPITAL_COMMUNITY): Payer: Self-pay

## 2022-02-17 ENCOUNTER — Telehealth: Payer: Self-pay | Admitting: Pharmacist

## 2022-02-17 DIAGNOSIS — E669 Obesity, unspecified: Secondary | ICD-10-CM

## 2022-02-17 MED ORDER — WEGOVY 2.4 MG/0.75ML ~~LOC~~ SOAJ
2.4000 mg | SUBCUTANEOUS | 5 refills | Status: DC
  Filled 2022-02-17: qty 3, 28d supply, fill #0
  Filled 2022-03-16: qty 3, 28d supply, fill #1
  Filled 2022-04-13: qty 3, 28d supply, fill #2
  Filled 2022-05-10: qty 3, 28d supply, fill #3

## 2022-02-17 NOTE — Telephone Encounter (Signed)
PA approval for Wegovy effective 02/17/22 to 02/17/23 Pharmacy notified

## 2022-03-16 ENCOUNTER — Other Ambulatory Visit (HOSPITAL_COMMUNITY): Payer: Self-pay

## 2022-03-22 DIAGNOSIS — M79641 Pain in right hand: Secondary | ICD-10-CM | POA: Diagnosis not present

## 2022-03-22 DIAGNOSIS — G5601 Carpal tunnel syndrome, right upper limb: Secondary | ICD-10-CM | POA: Diagnosis not present

## 2022-04-13 ENCOUNTER — Other Ambulatory Visit (HOSPITAL_COMMUNITY): Payer: Self-pay

## 2022-04-18 ENCOUNTER — Other Ambulatory Visit (HOSPITAL_COMMUNITY): Payer: Self-pay

## 2022-04-18 ENCOUNTER — Encounter (HOSPITAL_COMMUNITY): Payer: Self-pay

## 2022-04-25 ENCOUNTER — Other Ambulatory Visit (HOSPITAL_COMMUNITY): Payer: Self-pay

## 2022-05-10 ENCOUNTER — Other Ambulatory Visit: Payer: Self-pay

## 2022-05-11 ENCOUNTER — Other Ambulatory Visit (HOSPITAL_COMMUNITY): Payer: Self-pay

## 2022-05-11 ENCOUNTER — Encounter (HOSPITAL_COMMUNITY): Payer: Self-pay

## 2022-05-12 ENCOUNTER — Other Ambulatory Visit (HOSPITAL_COMMUNITY): Payer: Self-pay

## 2022-05-16 ENCOUNTER — Other Ambulatory Visit (HOSPITAL_COMMUNITY): Payer: Self-pay

## 2022-05-16 ENCOUNTER — Encounter (HOSPITAL_COMMUNITY): Payer: Self-pay

## 2022-05-18 ENCOUNTER — Other Ambulatory Visit: Payer: Self-pay

## 2022-05-25 ENCOUNTER — Telehealth: Payer: Commercial Managed Care - PPO | Admitting: Physician Assistant

## 2022-05-25 DIAGNOSIS — R3989 Other symptoms and signs involving the genitourinary system: Secondary | ICD-10-CM

## 2022-05-25 MED ORDER — NITROFURANTOIN MONOHYD MACRO 100 MG PO CAPS: 100.0000 mg | ORAL_CAPSULE | Freq: Two times a day (BID) | ORAL | 0 refills | Status: DC

## 2022-05-25 NOTE — Progress Notes (Signed)

## 2022-05-25 NOTE — Progress Notes (Signed)
I have spent 5 minutes in review of e-visit questionnaire, review and updating patient chart, medical decision making and response to patient.   Rayme Bui Cody Locken, PA-C    

## 2022-05-29 ENCOUNTER — Ambulatory Visit (INDEPENDENT_AMBULATORY_CARE_PROVIDER_SITE_OTHER): Payer: Commercial Managed Care - PPO | Admitting: Family

## 2022-05-29 ENCOUNTER — Encounter: Payer: Self-pay | Admitting: Family

## 2022-05-29 VITALS — BP 132/82 | HR 84 | Temp 97.4°F | Ht 63.0 in | Wt 180.0 lb

## 2022-05-29 DIAGNOSIS — N3000 Acute cystitis without hematuria: Secondary | ICD-10-CM

## 2022-05-29 DIAGNOSIS — R300 Dysuria: Secondary | ICD-10-CM

## 2022-05-29 LAB — MICROSCOPIC EXAMINATION: Renal Epithel, UA: NONE SEEN /hpf

## 2022-05-29 LAB — URINALYSIS, COMPLETE
Bilirubin, UA: NEGATIVE
Glucose, UA: NEGATIVE
Ketones, UA: NEGATIVE
Nitrite, UA: NEGATIVE
RBC, UA: NEGATIVE
Specific Gravity, UA: >1.03 — ABNORMAL HIGH (ref 1.005–1.030)
Urobilinogen, Ur: 0.2 mg/dL (ref 0.2–1.0)
pH, UA: 5.5 (ref 5.0–7.5)

## 2022-05-29 MED ORDER — CIPROFLOXACIN HCL 500 MG PO TABS: 500.0000 mg | ORAL_TABLET | Freq: Two times a day (BID) | ORAL | 0 refills | Status: DC

## 2022-05-29 NOTE — Progress Notes (Signed)
   Subjective:    Patient ID: Bailey Valdez, female    DOB: 1965-09-22, 57 y.o.   MRN: [id number]  No chief complaint on file.  PT presents to the office today with urinary frequency and dysuria. She has completed Keflex and Macrobid without relief.  Dysuria  This is a new problem. The current episode started 1 to 4 weeks ago. The problem occurs intermittently. The problem has been waxing and waning. The pain is at a severity of 2/10. The pain is mild. There has been no fever. Associated symptoms include frequency, hesitancy and urgency. Pertinent negatives include no chills, discharge, flank pain, hematuria, nausea, possible pregnancy or vomiting. She has tried increased fluids for the symptoms. The treatment provided no relief.      Review of Systems  Constitutional:  Negative for chills.  Gastrointestinal:  Negative for nausea and vomiting.  Genitourinary:  Positive for dysuria, frequency, hesitancy and urgency. Negative for flank pain and hematuria.  All other systems reviewed and are negative.      Objective:   Physical Exam Vitals reviewed.  Constitutional:      General: She is not in acute distress.    Appearance: She is well-developed.  HENT:     Head: Normocephalic and atraumatic.     Right Ear: Tympanic membrane normal.     Left Ear: Tympanic membrane normal.  Eyes:     Pupils: Pupils are equal, round, and reactive to light.  Neck:     Thyroid: No thyromegaly.  Cardiovascular:     Rate and Rhythm: Normal rate and regular rhythm.     Heart sounds: Normal heart sounds. No murmur heard. Pulmonary:     Effort: Pulmonary effort is normal. No respiratory distress.     Breath sounds: Normal breath sounds. No wheezing.  Abdominal:     General: Bowel sounds are normal. There is no distension.     Palpations: Abdomen is soft.     Tenderness: There is no abdominal tenderness.  Musculoskeletal:        General: No tenderness. Normal range of motion.     Cervical back: Normal  range of motion and neck supple.  Skin:    General: Skin is warm and dry.  Neurological:     Mental Status: She is alert and oriented to person, place, and time.     Cranial Nerves: No cranial nerve deficit.     Deep Tendon Reflexes: Reflexes are normal and symmetric.  Psychiatric:        Behavior: Behavior normal.        Thought Content: Thought content normal.        Judgment: Judgment normal.     BP 132/82   Pulse 84   Temp (!) 97.4 F (36.3 C)   Ht 5\' 3"  (1.6 m)   Wt 180 lb (81.6 kg)   BMI 31.89 kg/m        Assessment & Plan:  YOSHINO BROCCOLI comes in today with chief complaint of Dysuria   Diagnosis and orders addressed:  1. Dysuria - Urinalysis, Complete - Urine Culture  2. Acute cystitis without hematuria Force fluids AZO over the counter X2 days RTO prn Culture pending - ciprofloxacin (CIPRO) 500 MG tablet; Take 1 tablet (500 mg total) by mouth 2 (two) times daily.  Dispense: 20 tablet; Refill: 0   Evelina Dun, FNP

## 2022-05-30 ENCOUNTER — Other Ambulatory Visit: Payer: Self-pay | Admitting: Family

## 2022-05-30 ENCOUNTER — Other Ambulatory Visit (HOSPITAL_COMMUNITY): Payer: Self-pay

## 2022-05-30 MED ORDER — NEBIVOLOL HCL 5 MG PO TABS
5.0000 mg | ORAL_TABLET | Freq: Every day | ORAL | 2 refills | Status: DC
  Filled 2022-05-30: qty 90, 90d supply, fill #0
  Filled 2022-08-23: qty 90, 90d supply, fill #1
  Filled 2023-01-30: qty 90, 90d supply, fill #2

## 2022-05-30 NOTE — Progress Notes (Signed)
Pt requesting refill of BP medications. Prescription sent to pharmacy

## 2022-05-31 LAB — URINE CULTURE

## 2022-06-08 ENCOUNTER — Other Ambulatory Visit (HOSPITAL_COMMUNITY): Payer: Self-pay

## 2022-06-08 ENCOUNTER — Encounter: Payer: Self-pay | Admitting: Family

## 2022-06-08 ENCOUNTER — Ambulatory Visit (INDEPENDENT_AMBULATORY_CARE_PROVIDER_SITE_OTHER): Payer: Commercial Managed Care - PPO | Admitting: Family

## 2022-06-08 ENCOUNTER — Telehealth: Payer: Self-pay | Admitting: Pharmacist

## 2022-06-08 VITALS — BP 114/74 | HR 96 | Temp 97.8°F | Ht 62.0 in | Wt 181.0 lb

## 2022-06-08 DIAGNOSIS — E785 Hyperlipidemia, unspecified: Secondary | ICD-10-CM | POA: Diagnosis not present

## 2022-06-08 DIAGNOSIS — F411 Generalized anxiety disorder: Secondary | ICD-10-CM

## 2022-06-08 DIAGNOSIS — Z0001 Encounter for general adult medical examination with abnormal findings: Secondary | ICD-10-CM

## 2022-06-08 DIAGNOSIS — Z6833 Body mass index (BMI) 33.0-33.9, adult: Secondary | ICD-10-CM | POA: Diagnosis not present

## 2022-06-08 DIAGNOSIS — I10 Essential (primary) hypertension: Secondary | ICD-10-CM

## 2022-06-08 DIAGNOSIS — E669 Obesity, unspecified: Secondary | ICD-10-CM

## 2022-06-08 DIAGNOSIS — F321 Major depressive disorder, single episode, moderate: Secondary | ICD-10-CM

## 2022-06-08 DIAGNOSIS — Z713 Dietary counseling and surveillance: Secondary | ICD-10-CM | POA: Diagnosis not present

## 2022-06-08 MED ORDER — ZEPBOUND 10 MG/0.5ML ~~LOC~~ SOAJ
10.0000 mg | SUBCUTANEOUS | 2 refills | Status: DC
  Filled 2022-06-08: qty 2, 28d supply, fill #0
  Filled 2022-07-03: qty 2, 28d supply, fill #1

## 2022-06-08 NOTE — Progress Notes (Signed)
Subjective:    Patient ID: Bailey Valdez, female    DOB: 04-19-1966, 57 y.o.   MRN: [id number]  Chief Complaint  Patient presents with   Annual Exam   Pt presents to the office today for CPE without pap.  Pt currently taking Wegovy 2.4. Her starting weight was 225 lb and now is 181 lbs. Patient's BMI is >30 mg/m2.  Patient's current BMI is Body mass index is 33.11 kg/m.Marland Kitchen  Patient has lost and maintained a 5% body weight loss.  Patient is currently enrolled in a healthy eating plan along with encouraged exercise.   Patient does not have a personal or family history of medullary thyroid carcinoma (MTC) or Multiple Endocrine Neoplasia syndrome type 2 (MEN 2).  Hypertension This is a chronic problem. The current episode started more than 1 year ago. The problem has been resolved since onset. The problem is controlled. Associated symptoms include anxiety. Pertinent negatives include no malaise/fatigue, peripheral edema or shortness of breath. Risk factors for coronary artery disease include dyslipidemia, obesity and sedentary lifestyle. The current treatment provides moderate improvement.  Hyperlipidemia This is a chronic problem. The current episode started more than 1 year ago. Exacerbating diseases include obesity. Pertinent negatives include no shortness of breath. Current antihyperlipidemic treatment includes diet change. The current treatment provides no improvement of lipids.  Depression        This is a chronic problem.  The current episode started more than 1 year ago.   Associated symptoms include no decreased concentration, no hopelessness and not sad.  Past treatments include SNRIs - Serotonin and norepinephrine reuptake inhibitors.  Past medical history includes anxiety.   Anxiety Presents for follow-up visit. Patient reports no decreased concentration, dry mouth, irritability or shortness of breath. Symptoms occur occasionally.        Review of Systems  Constitutional:  Negative  for irritability and malaise/fatigue.  Respiratory:  Negative for shortness of breath.   Psychiatric/Behavioral:  Positive for depression. Negative for decreased concentration.   All other systems reviewed and are negative.  Family History  Problem Relation Age of Onset   Crohn's disease Mother    Heart disease Father    Colon cancer Maternal Uncle    Crohn's disease Maternal Uncle    Breast cancer Maternal Grandmother    Social History   Socioeconomic History   Marital status: Married    Spouse name: Not on file   Number of children: Not on file   Years of education: Not on file   Highest education level: Not on file  Occupational History   Not on file  Tobacco Use   Smoking status: Never   Smokeless tobacco: Never  Vaping Use   Vaping Use: Never used  Substance and Sexual Activity   Alcohol use: No    Alcohol/week: 0.0 standard drinks of alcohol   Drug use: No   Sexual activity: Not on file  Other Topics Concern   Not on file  Social History Narrative   Not on file   Social Determinants of Health   Financial Resource Strain: Not on file  Food Insecurity: Not on file  Transportation Needs: Not on file  Physical Activity: Not on file  Stress: Not on file  Social Connections: Not on file       Objective:   Physical Exam Vitals reviewed.  Constitutional:      General: She is not in acute distress.    Appearance: She is well-developed. She is obese. She is  not ill-appearing.  HENT:     Head: Normocephalic and atraumatic.     Right Ear: Tympanic membrane normal.     Left Ear: Tympanic membrane normal.  Eyes:     Pupils: Pupils are equal, round, and reactive to light.  Neck:     Thyroid: No thyromegaly.  Cardiovascular:     Rate and Rhythm: Normal rate and regular rhythm.     Heart sounds: Normal heart sounds. No murmur heard. Pulmonary:     Effort: Pulmonary effort is normal. No respiratory distress.     Breath sounds: Normal breath sounds. No wheezing.   Abdominal:     General: Bowel sounds are normal. There is no distension.     Palpations: Abdomen is soft.     Tenderness: There is no abdominal tenderness.  Musculoskeletal:        General: No tenderness. Normal range of motion.     Cervical back: Normal range of motion and neck supple.  Skin:    General: Skin is warm and dry.  Neurological:     Mental Status: She is alert and oriented to person, place, and time.     Cranial Nerves: No cranial nerve deficit.     Deep Tendon Reflexes: Reflexes are normal and symmetric.  Psychiatric:        Behavior: Behavior normal.        Thought Content: Thought content normal.        Judgment: Judgment normal.       BP 114/74   Pulse 96   Temp 97.8 F (36.6 C) (Temporal)   Ht 5\' 2"  (1.575 m)   Wt 181 lb (82.1 kg)   SpO2 96%   BMI 33.11 kg/m      Assessment & Plan:  Bailey Valdez comes in today with chief complaint of Annual Exam   Diagnosis and orders addressed: 1. Depression, major, single episode, moderate (HCC) - CMP14+EGFR; Future  2. GAD (generalized anxiety disorder) - CMP14+EGFR; Future  3. Hyperlipidemia, unspecified hyperlipidemia type - CMP14+EGFR; Future - tirzepatide (ZEPBOUND) 10 MG/0.5ML Pen; Inject 10 mg into the skin once a week.  Dispense: 2 mL; Refill: 2  4. Primary hypertension - CMP14+EGFR; Future - tirzepatide (ZEPBOUND) 10 MG/0.5ML Pen; Inject 10 mg into the skin once a week.  Dispense: 2 mL; Refill: 2  5. Obesity (BMI 30-39.9) - CMP14+EGFR; Future - tirzepatide (ZEPBOUND) 10 MG/0.5ML Pen; Inject 10 mg into the skin once a week.  Dispense: 2 mL; Refill: 2  6. Weight loss counseling, encounter for - CMP14+EGFR; Future - tirzepatide (ZEPBOUND) 10 MG/0.5ML Pen; Inject 10 mg into the skin once a week.  Dispense: 2 mL; Refill: 2  7. Annual physical exam - CMP14+EGFR; Future - CBC with Differential/Platelet; Future - Lipid panel; Future - TSH; Future   Labs pending Zepbound 10 mg Health  Maintenance reviewed Diet and exercise encouraged  Follow up plan: 6 months    Evelina Dun, FNP

## 2022-06-09 ENCOUNTER — Other Ambulatory Visit (HOSPITAL_COMMUNITY): Payer: Self-pay

## 2022-06-10 ENCOUNTER — Other Ambulatory Visit (HOSPITAL_COMMUNITY): Payer: Self-pay

## 2022-07-03 ENCOUNTER — Other Ambulatory Visit (HOSPITAL_COMMUNITY): Payer: Self-pay

## 2022-07-05 ENCOUNTER — Other Ambulatory Visit (HOSPITAL_COMMUNITY): Payer: Self-pay

## 2022-07-13 ENCOUNTER — Other Ambulatory Visit: Payer: Self-pay

## 2022-07-13 ENCOUNTER — Other Ambulatory Visit: Payer: Self-pay | Admitting: Family

## 2022-07-13 MED ORDER — ZEPBOUND 12.5 MG/0.5ML ~~LOC~~ SOAJ
12.5000 mg | SUBCUTANEOUS | 1 refills | Status: DC
  Filled 2022-07-13: qty 2, 28d supply, fill #0
  Filled 2022-08-04: qty 2, 28d supply, fill #1

## 2022-07-13 NOTE — Progress Notes (Signed)
Will increase Zepbound to 12.5 mg from 10 mg.

## 2022-07-26 NOTE — Telephone Encounter (Signed)
PA completed for Zepbound initial

## 2022-08-04 ENCOUNTER — Other Ambulatory Visit (HOSPITAL_COMMUNITY): Payer: Self-pay

## 2022-08-04 ENCOUNTER — Other Ambulatory Visit: Payer: Self-pay

## 2022-08-07 ENCOUNTER — Other Ambulatory Visit (HOSPITAL_COMMUNITY): Payer: Self-pay

## 2022-08-07 ENCOUNTER — Encounter (HOSPITAL_COMMUNITY): Payer: Self-pay

## 2022-08-08 ENCOUNTER — Other Ambulatory Visit (HOSPITAL_COMMUNITY): Payer: Self-pay

## 2022-08-22 ENCOUNTER — Other Ambulatory Visit: Payer: Self-pay

## 2022-08-22 ENCOUNTER — Other Ambulatory Visit (HOSPITAL_COMMUNITY): Payer: Self-pay

## 2022-08-22 ENCOUNTER — Other Ambulatory Visit: Payer: Self-pay | Admitting: Family

## 2022-08-22 MED ORDER — ZEPBOUND 12.5 MG/0.5ML ~~LOC~~ SOAJ
12.5000 mg | SUBCUTANEOUS | 1 refills | Status: AC
  Filled 2022-08-22: qty 6, 84d supply, fill #0
  Filled 2022-08-23: qty 2, 28d supply, fill #0

## 2022-08-23 ENCOUNTER — Other Ambulatory Visit (HOSPITAL_COMMUNITY): Payer: Self-pay

## 2022-08-23 ENCOUNTER — Other Ambulatory Visit: Payer: Self-pay

## 2022-08-31 ENCOUNTER — Other Ambulatory Visit: Payer: Commercial Managed Care - PPO

## 2022-08-31 ENCOUNTER — Other Ambulatory Visit: Payer: Self-pay

## 2022-08-31 DIAGNOSIS — I10 Essential (primary) hypertension: Secondary | ICD-10-CM

## 2022-08-31 DIAGNOSIS — Z0000 Encounter for general adult medical examination without abnormal findings: Secondary | ICD-10-CM

## 2022-08-31 DIAGNOSIS — E785 Hyperlipidemia, unspecified: Secondary | ICD-10-CM

## 2022-08-31 LAB — THYROID PANEL WITH TSH

## 2022-08-31 LAB — LIPID PANEL

## 2022-08-31 LAB — CMP14+EGFR

## 2022-08-31 LAB — CBC WITH DIFFERENTIAL/PLATELET
EOS (ABSOLUTE): 0.2 10*3/uL (ref 0.0–0.4)
Hemoglobin: 14.8 g/dL (ref 11.1–15.9)
MCHC: 30.3 g/dL — ABNORMAL LOW (ref 31.5–35.7)
MCV: 89 fL (ref 79–97)
Neutrophils Absolute: 2.7 10*3/uL (ref 1.4–7.0)
Platelets: 261 10*3/uL (ref 150–450)

## 2022-08-31 LAB — BAYER DCA HB A1C WAIVED: HB A1C (BAYER DCA - WAIVED): 5.2 % (ref 4.8–5.6)

## 2022-09-01 LAB — CMP14+EGFR
ALT: 19 IU/L (ref 0–32)
AST: 18 IU/L (ref 0–40)
Albumin/Globulin Ratio: 1.7 (ref 1.2–2.2)
Albumin: 4.3 g/dL (ref 3.8–4.9)
BUN/Creatinine Ratio: 16 (ref 9–23)
BUN: 14 mg/dL (ref 6–24)
CO2: 22 mmol/L (ref 20–29)
Calcium: 9.2 mg/dL (ref 8.7–10.2)
Chloride: 105 mmol/L (ref 96–106)
Sodium: 144 mmol/L (ref 134–144)
eGFR: 77 mL/min/{1.73_m2} (ref >59)

## 2022-09-01 LAB — LIPID PANEL
HDL: 47 mg/dL (ref >39)
Triglycerides: 109 mg/dL (ref 0–149)
VLDL Cholesterol Cal: 20 mg/dL (ref 5–40)

## 2022-09-01 LAB — THYROID PANEL WITH TSH
Free Thyroxine Index: 1.9 (ref 1.2–4.9)
T3 Uptake Ratio: 28 % (ref 24–39)

## 2022-09-01 LAB — CBC WITH DIFFERENTIAL/PLATELET
Basophils Absolute: 0.1 10*3/uL (ref 0.0–0.2)
Basos: 1 %
Eos: 4 %
Hematocrit: 48.8 % — ABNORMAL HIGH (ref 34.0–46.6)
Immature Grans (Abs): 0 10*3/uL (ref 0.0–0.1)
Immature Granulocytes: 0 %
Lymphocytes Absolute: 1.9 10*3/uL (ref 0.7–3.1)
Lymphs: 35 %
MCH: 27.1 pg (ref 26.6–33.0)
Monocytes Absolute: 0.5 10*3/uL (ref 0.1–0.9)
Monocytes: 10 %
Neutrophils: 50 %
RBC: 5.46 x10E6/uL — ABNORMAL HIGH (ref 3.77–5.28)
RDW: 12.8 % (ref 11.7–15.4)
WBC: 5.4 10*3/uL (ref 3.4–10.8)

## 2022-09-18 ENCOUNTER — Other Ambulatory Visit: Payer: Self-pay

## 2022-09-22 ENCOUNTER — Other Ambulatory Visit: Payer: Self-pay | Admitting: Family

## 2022-10-04 ENCOUNTER — Other Ambulatory Visit (HOSPITAL_BASED_OUTPATIENT_CLINIC_OR_DEPARTMENT_OTHER): Payer: Self-pay

## 2022-11-06 ENCOUNTER — Other Ambulatory Visit: Payer: Self-pay | Admitting: Family

## 2022-11-06 MED ORDER — CIPROFLOXACIN HCL 500 MG PO TABS: 500.0000 mg | ORAL_TABLET | Freq: Two times a day (BID) | ORAL | 0 refills | Status: DC

## 2022-11-06 NOTE — Progress Notes (Signed)
Pt calls complaining of UTI symptoms. Cipro Prescription sent to pharmacy.   Jannifer Rodney, FNP

## 2023-01-24 DIAGNOSIS — G5603 Carpal tunnel syndrome, bilateral upper limbs: Secondary | ICD-10-CM | POA: Diagnosis not present

## 2023-01-24 DIAGNOSIS — G5602 Carpal tunnel syndrome, left upper limb: Secondary | ICD-10-CM | POA: Diagnosis not present

## 2023-01-24 DIAGNOSIS — G5622 Lesion of ulnar nerve, left upper limb: Secondary | ICD-10-CM | POA: Diagnosis not present

## 2023-01-30 ENCOUNTER — Other Ambulatory Visit: Payer: Self-pay

## 2023-01-30 ENCOUNTER — Other Ambulatory Visit: Payer: Self-pay | Admitting: Family

## 2023-01-30 ENCOUNTER — Other Ambulatory Visit (HOSPITAL_COMMUNITY): Payer: Self-pay

## 2023-01-30 DIAGNOSIS — F411 Generalized anxiety disorder: Secondary | ICD-10-CM

## 2023-01-30 DIAGNOSIS — F321 Major depressive disorder, single episode, moderate: Secondary | ICD-10-CM

## 2023-01-30 MED ORDER — VILAZODONE HCL 40 MG PO TABS
20.0000 mg | ORAL_TABLET | Freq: Every day | ORAL | 0 refills | Status: DC
Start: 2023-01-30 — End: 2023-04-24
  Filled 2023-01-30: qty 30, 30d supply, fill #0

## 2023-01-31 ENCOUNTER — Other Ambulatory Visit (HOSPITAL_COMMUNITY): Payer: Self-pay

## 2023-02-01 ENCOUNTER — Other Ambulatory Visit: Payer: Self-pay

## 2023-02-01 ENCOUNTER — Encounter: Payer: Self-pay | Admitting: Pharmacist

## 2023-02-01 ENCOUNTER — Other Ambulatory Visit (HOSPITAL_COMMUNITY): Payer: Self-pay

## 2023-02-02 ENCOUNTER — Other Ambulatory Visit: Payer: Self-pay

## 2023-02-14 DIAGNOSIS — H524 Presbyopia: Secondary | ICD-10-CM | POA: Diagnosis not present

## 2023-02-16 ENCOUNTER — Other Ambulatory Visit: Payer: Self-pay | Admitting: Family

## 2023-02-16 DIAGNOSIS — Z1231 Encounter for screening mammogram for malignant neoplasm of breast: Secondary | ICD-10-CM

## 2023-02-19 ENCOUNTER — Inpatient Hospital Stay
Admission: RE | Admit: 2023-02-19 | Discharge: 2023-02-19 | Disposition: A | Discharge status: Home / Self Care | Payer: Commercial Managed Care - PPO | Source: Ambulatory Visit | Attending: Family | Admitting: Family

## 2023-02-19 DIAGNOSIS — Z1231 Encounter for screening mammogram for malignant neoplasm of breast: Secondary | ICD-10-CM | POA: Diagnosis not present

## 2023-04-24 ENCOUNTER — Other Ambulatory Visit: Payer: Self-pay | Admitting: Family Medicine

## 2023-04-24 DIAGNOSIS — F411 Generalized anxiety disorder: Secondary | ICD-10-CM

## 2023-04-24 DIAGNOSIS — F321 Major depressive disorder, single episode, moderate: Secondary | ICD-10-CM

## 2023-04-24 MED ORDER — VILAZODONE HCL 40 MG PO TABS
20.0000 mg | ORAL_TABLET | Freq: Every day | ORAL | 3 refills | Status: DC
Start: 2023-04-24 — End: 2023-12-31
  Filled 2023-04-24: qty 90, 90d supply, fill #0
  Filled 2023-11-21: qty 90, 90d supply, fill #1

## 2023-04-25 ENCOUNTER — Other Ambulatory Visit: Payer: Self-pay

## 2023-04-25 ENCOUNTER — Other Ambulatory Visit (HOSPITAL_COMMUNITY): Payer: Self-pay

## 2023-05-25 ENCOUNTER — Ambulatory Visit (INDEPENDENT_AMBULATORY_CARE_PROVIDER_SITE_OTHER): Payer: Commercial Managed Care - PPO | Admitting: Family Medicine

## 2023-05-25 ENCOUNTER — Encounter: Payer: Self-pay | Admitting: Family Medicine

## 2023-05-25 DIAGNOSIS — G5601 Carpal tunnel syndrome, right upper limb: Secondary | ICD-10-CM | POA: Diagnosis not present

## 2023-05-25 MED ORDER — METHYLPREDNISOLONE ACETATE 40 MG/ML IJ SUSP
40.0000 mg | Freq: Once | INTRAMUSCULAR | Status: AC
  Administered 2023-05-25: 40 mg via INTRAMUSCULAR

## 2023-05-25 NOTE — Progress Notes (Signed)
 There were no vitals taken for this visit.   Subjective:   Patient ID: Bailey Valdez, female    DOB: 12/07/1965, 58 y.o.   MRN: [id number]  HPI: Bailey Valdez is a 58 y.o. female presenting on 05/25/2023 for No chief complaint on file.   HPI Patient is coming in today with carpal tunnel.  She says she has had it off and on for years but is really worsened over the past few weeks.  She says she is starting to feel where she is getting numbness and pain into especially her thumb.  She has seen a hand specialist before and had an injection previously and it did help and she says it is getting point where she needs to do something now.  Relevant past medical, surgical, family and social history reviewed and updated as indicated. Interim medical history since our last visit reviewed. Allergies and medications reviewed and updated.  Review of Systems  Constitutional:  Negative for chills and fever.  Musculoskeletal:  Negative for arthralgias, joint swelling and myalgias.  Neurological:  Positive for weakness and numbness.    Per HPI unless specifically indicated above   Allergies as of 05/25/2023       Reactions   Penicillins    Causes anaphylaxis   Sulfa Antibiotics    anaphylaxis        Medication List        Accurate as of May 25, 2023 12:16 PM. If you have any questions, ask your nurse or doctor.          ciprofloxacin  500 MG tablet Commonly known as: Cipro  Take 1 tablet (500 mg total) by mouth 2 (two) times daily.   fluticasone  50 MCG/ACT nasal spray Commonly known as: FLONASE  PLACE 2 SPRAYS INTO BOTH NOSTRILS DAILY.   nebivolol  5 MG tablet Commonly known as: Bystolic  Take 1 tablet (5 mg total) by mouth daily.   Vilazodone  HCl 40 MG Tabs Commonly known as: VIIBRYD  Take 0.5-1 tablets (20-40 mg total) by mouth daily. **NEEDS TO BE SEEN BEFORE NEXT REFILL**   Zepbound  12.5 MG/0.5ML Pen Generic drug: tirzepatide  Inject 12.5 mg into the skin once a week.          Objective:   There were no vitals taken for this visit.  Wt Readings from Last 3 Encounters:  06/08/22 181 lb (82.1 kg)  05/29/22 180 lb (81.6 kg)  10/20/21 183 lb 9.6 oz (83.3 kg)    Physical Exam Vitals and nursing note reviewed.  Constitutional:      Appearance: Normal appearance.  Musculoskeletal:     Right hand: Tenderness (antertior wrist) present. No swelling. Normal range of motion.  Skin:    General: Skin is warm and dry.     Findings: No bruising.  Neurological:     Mental Status: She is alert.     Right carpal tunnel injection: Verbal consent obtained. Risk factors of bleeding and infection discussed with patient and patient is agreeable towards injection. Patient prepped with Betadine.  Anterior approach towards injection used. Injected 40 mg of Depo-Medrol  and 1 mL of 2% lidocaine. Patient tolerated procedure well and no side effects from noted. Minimal to no bleeding. Simple bandage applied after.   Assessment & Plan:   Problem List Items Addressed This Visit   None Visit Diagnoses       Carpal tunnel syndrome of right wrist    -  Primary   Relevant Medications   methylPREDNISolone  acetate (DEPO-MEDROL ) injection 40 mg (  Start on 05/25/2023 12:30 PM)     Did carpal tunnel injection and see if improves, if not then she needs to go see her hand specialist again.  Follow up plan: Return if symptoms worsen or fail to improve.  Counseling provided for all of the vaccine components No orders of the defined types were placed in this encounter.   Fonda Levins, MD [facility] 05/25/2023, 12:16 PM

## 2023-06-01 ENCOUNTER — Other Ambulatory Visit: Payer: Self-pay | Admitting: Family Medicine

## 2023-06-01 ENCOUNTER — Other Ambulatory Visit: Payer: Self-pay

## 2023-06-01 MED ORDER — NEBIVOLOL HCL 5 MG PO TABS
5.0000 mg | ORAL_TABLET | Freq: Every day | ORAL | 2 refills | Status: DC
  Filled 2023-06-01: qty 90, 90d supply, fill #0
  Filled 2023-10-15: qty 90, 90d supply, fill #1
  Filled 2023-11-21: qty 90, 90d supply, fill #2

## 2023-07-19 ENCOUNTER — Other Ambulatory Visit: Payer: Self-pay | Admitting: Family

## 2023-07-20 ENCOUNTER — Other Ambulatory Visit: Payer: Self-pay

## 2023-07-20 ENCOUNTER — Other Ambulatory Visit (HOSPITAL_BASED_OUTPATIENT_CLINIC_OR_DEPARTMENT_OTHER): Payer: Self-pay

## 2023-07-20 MED ORDER — FLUTICASONE PROPIONATE 50 MCG/ACT NA SUSP
2.0000 | Freq: Every day | NASAL | 6 refills | Status: AC
Start: 2023-07-20 — End: 2024-07-19
  Filled 2023-07-20: qty 16, 30d supply, fill #0

## 2023-08-09 ENCOUNTER — Other Ambulatory Visit: Payer: Self-pay | Admitting: Family

## 2023-08-09 MED ORDER — NITROFURANTOIN MONOHYD MACRO 100 MG PO CAPS: 100.0000 mg | ORAL_CAPSULE | Freq: Two times a day (BID) | ORAL | 0 refills | Status: AC

## 2023-09-10 ENCOUNTER — Other Ambulatory Visit: Payer: Self-pay | Admitting: Family Medicine

## 2023-09-10 ENCOUNTER — Telehealth (INDEPENDENT_AMBULATORY_CARE_PROVIDER_SITE_OTHER): Admitting: Family Medicine

## 2023-09-10 ENCOUNTER — Ambulatory Visit

## 2023-09-10 VITALS — BP 137/86 | HR 83 | Ht 62.0 in

## 2023-09-10 DIAGNOSIS — E785 Hyperlipidemia, unspecified: Secondary | ICD-10-CM

## 2023-09-10 DIAGNOSIS — E079 Disorder of thyroid, unspecified: Secondary | ICD-10-CM

## 2023-09-10 NOTE — Progress Notes (Signed)
 Changed to thyroid  ultrasound without FNA because they required that before they would do the FNA.

## 2023-09-10 NOTE — Progress Notes (Signed)
 BP 137/86   Pulse 83   Ht 5\' 2"  (1.575 m)   SpO2 99%   BMI 33.11 kg/m    Subjective:   Patient ID: Bailey Valdez, female    DOB: [birth year], 58 y.o.   MRN: [id number]  HPI: Bailey Valdez is a 58 y.o. female presenting on 09/10/2023 for No chief complaint on file.   HPI Patient comes in today with complaints of left sided neck mass/nodule that she feels like she noticed over the past week or 2.  She does not feel like she noticed it before.  She feels like the lump is large and it does move and it is not painful to touch.  She says she has had some recent allergies and little bit of hoarseness and a cold going on but no other symptoms of anything else.  She denies any hair changes that she notices or nail changes.  She does feel like her hot and cold temperature regulation is a little bit off recently.  She denies any chest pain or palpitations.  She denies any feelings of being overly fatigued.  Her hoarseness that she has from her recent allergies, she feels like is getting better.  Relevant past medical, surgical, family and social history reviewed and updated as indicated. Interim medical history since our last visit reviewed. Allergies and medications reviewed and updated.  Review of Systems  Constitutional:  Negative for chills and fever.  HENT:  Positive for congestion and voice change.   Eyes:  Negative for redness and visual disturbance.  Respiratory:  Negative for chest tightness and shortness of breath.   Cardiovascular:  Negative for chest pain and leg swelling.  Endocrine: Positive for cold intolerance and heat intolerance.  Musculoskeletal:  Negative for back pain and gait problem.  Skin:  Negative for rash.  Neurological:  Negative for light-headedness and headaches.  Psychiatric/Behavioral:  Negative for agitation and behavioral problems.   All other systems reviewed and are negative.   Per HPI unless specifically indicated above   Allergies as of 09/10/2023        Reactions   Penicillins    Causes anaphylaxis   Sulfa Antibiotics    anaphylaxis        Medication List        Accurate as of September 10, 2023  8:11 AM. If you have any questions, ask your nurse or doctor.          ciprofloxacin  500 MG tablet Commonly known as: Cipro  Take 1 tablet (500 mg total) by mouth 2 (two) times daily.   fluticasone  50 MCG/ACT nasal spray Commonly known as: FLONASE  PLACE 2 SPRAYS INTO BOTH NOSTRILS DAILY.   nebivolol  5 MG tablet Commonly known as: Bystolic  Take 1 tablet (5 mg total) by mouth daily.   Vilazodone  HCl 40 MG Tabs Commonly known as: VIIBRYD  Take 0.5-1 tablets (20-40 mg total) by mouth daily. **NEEDS TO BE SEEN BEFORE NEXT REFILL**   Zepbound  12.5 MG/0.5ML Pen Generic drug: tirzepatide  Inject 12.5 mg into the skin once a week.         Objective:   BP 137/86   Pulse 83   Ht 5\' 2"  (1.575 m)   SpO2 99%   BMI 33.11 kg/m   Wt Readings from Last 3 Encounters:  06/08/22 181 lb (82.1 kg)  05/29/22 180 lb (81.6 kg)  10/20/21 183 lb 9.6 oz (83.3 kg)    Physical Exam Vitals and nursing note reviewed.  Constitutional:  General: She is not in acute distress.    Appearance: She is well-developed. She is not diaphoretic.  Eyes:     Conjunctiva/sclera: Conjunctivae normal.  Neck:     Thyroid : Thyroid  mass present.   Musculoskeletal:     Cervical back: No crepitus. No pain with movement.  Lymphadenopathy:     Cervical: No cervical adenopathy.     Right cervical: No superficial or deep cervical adenopathy.    Left cervical: No superficial or deep cervical adenopathy.  Skin:    General: Skin is warm and dry.     Findings: No rash.  Neurological:     Mental Status: She is alert and oriented to person, place, and time.     Coordination: Coordination normal.  Psychiatric:        Behavior: Behavior normal.      Assessment & Plan:   Problem List Items Addressed This Visit       Other   Hyperlipidemia   Relevant  Orders   CMP14+EGFR   Lipid panel   Other Visit Diagnoses       Mass of thyroid  gland    -  Primary   Relevant Orders   US  FNA BX THYROID  1ST LESION AFIRMA   Thyroid  Panel With TSH   CMP14+EGFR   CBC with Differential/Platelet       Will order thyroid  panel and thyroid  u/s for nodule Follow up plan: Return if symptoms worsen or fail to improve.  Counseling provided for all of the vaccine components Orders Placed This Encounter  Procedures   US  FNA BX THYROID  1ST LESION AFIRMA   Thyroid  Panel With TSH   CMP14+EGFR   CBC with Differential/Platelet   Lipid panel    Jolyne Needs, MD [facility] 09/10/2023, 8:11 AM

## 2023-09-11 LAB — CBC WITH DIFFERENTIAL/PLATELET
Basophils Absolute: 0.1 10*3/uL (ref 0.0–0.2)
Basos: 1 %
EOS (ABSOLUTE): 0.2 10*3/uL (ref 0.0–0.4)
Eos: 3 %
Hematocrit: 43.5 % (ref 34.0–46.6)
Hemoglobin: 14.2 g/dL (ref 11.1–15.9)
Immature Grans (Abs): 0 10*3/uL (ref 0.0–0.1)
Immature Granulocytes: 0 %
Lymphocytes Absolute: 1.8 10*3/uL (ref 0.7–3.1)
Lymphs: 24 %
MCH: 29.1 pg (ref 26.6–33.0)
MCHC: 32.6 g/dL (ref 31.5–35.7)
MCV: 89 fL (ref 79–97)
Monocytes Absolute: 0.7 10*3/uL (ref 0.1–0.9)
Monocytes: 9 %
Neutrophils Absolute: 4.8 10*3/uL (ref 1.4–7.0)
Neutrophils: 63 %
Platelets: 279 10*3/uL (ref 150–450)
RBC: 4.88 x10E6/uL (ref 3.77–5.28)
RDW: 12.4 % (ref 11.7–15.4)
WBC: 7.5 10*3/uL (ref 3.4–10.8)

## 2023-09-11 LAB — LIPID PANEL
Chol/HDL Ratio: 4.2 ratio (ref 0.0–4.4)
Cholesterol, Total: 171 mg/dL (ref 100–199)
HDL: 41 mg/dL (ref >39)
LDL Chol Calc (NIH): 108 mg/dL — ABNORMAL HIGH (ref 0–99)
Triglycerides: 122 mg/dL (ref 0–149)
VLDL Cholesterol Cal: 22 mg/dL (ref 5–40)

## 2023-09-11 LAB — CMP14+EGFR
ALT: 15 IU/L (ref 0–32)
AST: 21 IU/L (ref 0–40)
Albumin: 4 g/dL (ref 3.8–4.9)
Alkaline Phosphatase: 78 IU/L (ref 44–121)
BUN/Creatinine Ratio: 16 (ref 9–23)
BUN: 12 mg/dL (ref 6–24)
Bilirubin Total: 0.4 mg/dL (ref 0.0–1.2)
CO2: 24 mmol/L (ref 20–29)
Calcium: 8.9 mg/dL (ref 8.7–10.2)
Chloride: 107 mmol/L — ABNORMAL HIGH (ref 96–106)
Creatinine, Ser: 0.73 mg/dL (ref 0.57–1.00)
Globulin, Total: 2.3 g/dL (ref 1.5–4.5)
Glucose: 96 mg/dL (ref 70–99)
Potassium: 3.7 mmol/L (ref 3.5–5.2)
Sodium: 145 mmol/L — ABNORMAL HIGH (ref 134–144)
Total Protein: 6.3 g/dL (ref 6.0–8.5)
eGFR: 95 mL/min/{1.73_m2} (ref >59)

## 2023-09-11 LAB — THYROID PANEL WITH TSH
Free Thyroxine Index: 1.7 (ref 1.2–4.9)
T3 Uptake Ratio: 27 % (ref 24–39)
T4, Total: 6.4 ug/dL (ref 4.5–12.0)
TSH: 2.07 u[IU]/mL (ref 0.450–4.500)

## 2023-09-12 ENCOUNTER — Ambulatory Visit (HOSPITAL_COMMUNITY)
Admission: RE | Admit: 2023-09-12 | Discharge: 2023-09-12 | Disposition: A | Discharge status: Home / Self Care | Source: Ambulatory Visit | Attending: Family Medicine | Admitting: Family Medicine

## 2023-09-12 ENCOUNTER — Ambulatory Visit (HOSPITAL_COMMUNITY): Admission: RE | Admit: 2023-09-12 | Source: Ambulatory Visit

## 2023-09-12 DIAGNOSIS — E079 Disorder of thyroid, unspecified: Secondary | ICD-10-CM | POA: Insufficient documentation

## 2023-09-12 DIAGNOSIS — R590 Localized enlarged lymph nodes: Secondary | ICD-10-CM | POA: Diagnosis not present

## 2023-09-13 ENCOUNTER — Encounter: Payer: Self-pay | Admitting: Family Medicine

## 2023-09-14 ENCOUNTER — Encounter: Payer: Self-pay | Admitting: Family Medicine

## 2023-10-12 ENCOUNTER — Encounter: Payer: Self-pay | Admitting: Family

## 2023-10-12 ENCOUNTER — Other Ambulatory Visit: Payer: Self-pay

## 2023-10-12 ENCOUNTER — Ambulatory Visit: Admitting: Family

## 2023-10-12 VITALS — BP 135/76 | HR 79 | Temp 98.2°F | Resp 18 | Ht 62.0 in | Wt 172.0 lb

## 2023-10-12 DIAGNOSIS — F5101 Primary insomnia: Secondary | ICD-10-CM

## 2023-10-12 MED ORDER — ZOLPIDEM TARTRATE 10 MG PO TABS
10.0000 mg | ORAL_TABLET | Freq: Every evening | ORAL | 1 refills | Status: DC | PRN
  Filled 2023-10-12: qty 90, 90d supply, fill #0

## 2023-10-12 NOTE — Patient Instructions (Signed)
 Insomnia Insomnia is a sleep disorder that makes it difficult to fall asleep or stay asleep. Insomnia can cause fatigue, low energy, difficulty concentrating, mood swings, and poor performance at work or school. There are three different ways to classify insomnia: Difficulty falling asleep. Difficulty staying asleep. Waking up too early in the morning. Any type of insomnia can be long-term (chronic) or short-term (acute). Both are common. Short-term insomnia usually lasts for 3 months or less. Chronic insomnia occurs at least three times a week for longer than 3 months. What are the causes? Insomnia may be caused by another condition, situation, or substance, such as: Having certain mental health conditions, such as anxiety and depression. Using caffeine, alcohol, tobacco, or drugs. Having gastrointestinal conditions, such as gastroesophageal reflux disease (GERD). Having certain medical conditions. These include: Asthma. Alzheimer's disease. Stroke. Chronic pain. An overactive thyroid gland (hyperthyroidism). Other sleep disorders, such as restless legs syndrome and sleep apnea. Menopause. Sometimes, the cause of insomnia may not be known. What increases the risk? Risk factors for insomnia include: Gender. Females are affected more often than males. Age. Insomnia is more common as people get older. Stress and certain medical and mental health conditions. Lack of exercise. Having an irregular work schedule. This may include working night shifts and traveling between different time zones. What are the signs or symptoms? If you have insomnia, the main symptom is having trouble falling asleep or having trouble staying asleep. This may lead to other symptoms, such as: Feeling tired or having low energy. Feeling nervous about going to sleep. Not feeling rested in the morning. Having trouble concentrating. Feeling irritable, anxious, or depressed. How is this diagnosed? This condition  may be diagnosed based on: Your symptoms and medical history. Your health care provider may ask about: Your sleep habits. Any medical conditions you have. Your mental health. A physical exam. How is this treated? Treatment for insomnia depends on the cause. Treatment may focus on treating an underlying condition that is causing the insomnia. Treatment may also include: Medicines to help you sleep. Counseling or therapy. Lifestyle adjustments to help you sleep better. Follow these instructions at home: Eating and drinking  Limit or avoid alcohol, caffeinated beverages, and products that contain nicotine and tobacco, especially close to bedtime. These can disrupt your sleep. Do not eat a large meal or eat spicy foods right before bedtime. This can lead to digestive discomfort that can make it hard for you to sleep. Sleep habits  Keep a sleep diary to help you and your health care provider figure out what could be causing your insomnia. Write down: When you sleep. When you wake up during the night. How well you sleep and how rested you feel the next day. Any side effects of medicines you are taking. What you eat and drink. Make your bedroom a dark, comfortable place where it is easy to fall asleep. Put up shades or blackout curtains to block light from outside. Use a white noise machine to block noise. Keep the temperature cool. Limit screen use before bedtime. This includes: Not watching TV. Not using your smartphone, tablet, or computer. Stick to a routine that includes going to bed and waking up at the same times every day and night. This can help you fall asleep faster. Consider making a quiet activity, such as reading, part of your nighttime routine. Try to avoid taking naps during the day so that you sleep better at night. Get out of bed if you are still awake after  15 minutes of trying to sleep. Keep the lights down, but try reading or doing a quiet activity. When you feel  sleepy, go back to bed. General instructions Take over-the-counter and prescription medicines only as told by your health care provider. Exercise regularly as told by your health care provider. However, avoid exercising in the hours right before bedtime. Use relaxation techniques to manage stress. Ask your health care provider to suggest some techniques that may work well for you. These may include: Breathing exercises. Routines to release muscle tension. Visualizing peaceful scenes. Make sure that you drive carefully. Do not drive if you feel very sleepy. Keep all follow-up visits. This is important. Contact a health care provider if: You are tired throughout the day. You have trouble in your daily routine due to sleepiness. You continue to have sleep problems, or your sleep problems get worse. Get help right away if: You have thoughts about hurting yourself or someone else. Get help right away if you feel like you may hurt yourself or others, or have thoughts about taking your own life. Go to your nearest emergency room or: Call 911. Call the National Suicide Prevention Lifeline at (906)021-1611 or 988. This is open 24 hours a day. Text the Crisis Text Line at 325-069-2793. Summary Insomnia is a sleep disorder that makes it difficult to fall asleep or stay asleep. Insomnia can be long-term (chronic) or short-term (acute). Treatment for insomnia depends on the cause. Treatment may focus on treating an underlying condition that is causing the insomnia. Keep a sleep diary to help you and your health care provider figure out what could be causing your insomnia. This information is not intended to replace advice given to you by your health care provider. Make sure you discuss any questions you have with your health care provider. Document Revised: 04/11/2021 Document Reviewed: 04/11/2021 Elsevier Patient Education  2024 [employer].

## 2023-10-12 NOTE — Progress Notes (Signed)
 Subjective:    Patient ID: Bailey Valdez, female    DOB: [birth year], 58 y.o.   MRN: [id number]  No chief complaint on file.  Pt presents to the office today with complaints of insomnia. Reports this has been on going for a year. States this has worsen and only getting 3 hours a sleep a night.  Has tried melatonin, tylenol pm,  zzzquil, and Trazone without relief.  Insomnia Primary symptoms: difficulty falling asleep.   The current episode started more than one year. The onset quality is gradual. The problem occurs intermittently. Past treatments include meditation and medication. The treatment provided mild relief.      Review of Systems  Psychiatric/Behavioral:  The patient has insomnia.   All other systems reviewed and are negative.   Social History   Socioeconomic History   Marital status: Married    Spouse name: Not on file   Number of children: Not on file   Years of education: Not on file   Highest education level: Not on file  Occupational History   Not on file  Tobacco Use   Smoking status: Never   Smokeless tobacco: Never  Vaping Use   Vaping status: Never Used  Substance and Sexual Activity   Alcohol use: No    Alcohol/week: 0.0 standard drinks of alcohol   Drug use: No   Sexual activity: Not on file  Other Topics Concern   Not on file  Social History Narrative   Not on file   Social Drivers of Health   Financial Resource Strain: Not on file  Food Insecurity: Not on file  Transportation Needs: Not on file  Physical Activity: Not on file  Stress: Not on file  Social Connections: Not on file   Family History  Problem Relation Age of Onset   Crohn's disease Mother    Heart disease Father    Colon cancer Maternal Uncle    Crohn's disease Maternal Uncle    Breast cancer Maternal Grandmother         Objective:   Physical Exam Vitals reviewed.  Constitutional:      General: She is not in acute distress.    Appearance: She is well-developed.   HENT:     Head: Normocephalic and atraumatic.     Right Ear: Tympanic membrane normal.     Left Ear: Tympanic membrane normal.  Eyes:     Pupils: Pupils are equal, round, and reactive to light.  Neck:     Thyroid : No thyromegaly.  Cardiovascular:     Rate and Rhythm: Normal rate and regular rhythm.     Heart sounds: Normal heart sounds. No murmur heard. Pulmonary:     Effort: Pulmonary effort is normal. No respiratory distress.     Breath sounds: Normal breath sounds. No wheezing.  Abdominal:     General: Bowel sounds are normal. There is no distension.     Palpations: Abdomen is soft.     Tenderness: There is no abdominal tenderness.  Musculoskeletal:        General: No tenderness. Normal range of motion.     Cervical back: Normal range of motion and neck supple.  Skin:    General: Skin is warm and dry.  Neurological:     Mental Status: She is alert and oriented to person, place, and time.     Cranial Nerves: No cranial nerve deficit.     Deep Tendon Reflexes: Reflexes are normal and symmetric.  Psychiatric:  Behavior: Behavior normal.        Thought Content: Thought content normal.        Judgment: Judgment normal.       BP 135/76   Pulse 79   Temp 98.2 F (36.8 C) (Temporal)   Resp 18      Assessment & Plan:  Bailey Valdez comes in today with chief complaint of No chief complaint on file.   Diagnosis and orders addressed:  1. Primary insomnia (Primary) Start ambien  Sleep ritual  Keep chronic follow up - zolpidem (AMBIEN) 10 MG tablet; Take 1 tablet (10 mg total) by mouth at bedtime as needed for sleep.  Dispense: 90 tablet; Refill: 1    Tommas Fragmin, FNP

## 2023-10-15 ENCOUNTER — Other Ambulatory Visit (HOSPITAL_COMMUNITY): Payer: Self-pay

## 2023-11-08 DIAGNOSIS — G5601 Carpal tunnel syndrome, right upper limb: Secondary | ICD-10-CM | POA: Diagnosis not present

## 2023-11-21 ENCOUNTER — Other Ambulatory Visit: Payer: Self-pay

## 2023-11-27 ENCOUNTER — Encounter: Payer: Self-pay | Admitting: Family

## 2023-11-27 ENCOUNTER — Ambulatory Visit: Admitting: Family

## 2023-11-27 VITALS — BP 125/80 | HR 76 | Temp 97.0°F | Ht 62.0 in | Wt 172.0 lb

## 2023-11-27 DIAGNOSIS — B379 Candidiasis, unspecified: Secondary | ICD-10-CM | POA: Diagnosis not present

## 2023-11-27 DIAGNOSIS — T3695XA Adverse effect of unspecified systemic antibiotic, initial encounter: Secondary | ICD-10-CM | POA: Diagnosis not present

## 2023-11-27 DIAGNOSIS — R300 Dysuria: Secondary | ICD-10-CM

## 2023-11-27 DIAGNOSIS — N3000 Acute cystitis without hematuria: Secondary | ICD-10-CM

## 2023-11-27 LAB — MICROSCOPIC EXAMINATION
Epithelial Cells (non renal): NONE SEEN /HPF (ref 0–10)
Renal Epithel, UA: NONE SEEN /HPF
Yeast, UA: NONE SEEN

## 2023-11-27 LAB — URINALYSIS, COMPLETE
Bilirubin, UA: NEGATIVE
Glucose, UA: NEGATIVE
Ketones, UA: NEGATIVE
Nitrite, UA: POSITIVE — AB
Protein,UA: NEGATIVE
Specific Gravity, UA: 1.025 (ref 1.005–1.030)
Urobilinogen, Ur: 0.2 mg/dL (ref 0.2–1.0)
pH, UA: 5.5 (ref 5.0–7.5)

## 2023-11-27 MED ORDER — FLUCONAZOLE 150 MG PO TABS: 150.0000 mg | ORAL_TABLET | ORAL | 0 refills | Status: DC | PRN

## 2023-11-27 MED ORDER — CIPROFLOXACIN HCL 500 MG PO TABS: 500.0000 mg | ORAL_TABLET | Freq: Two times a day (BID) | ORAL | 0 refills | Status: DC

## 2023-11-27 NOTE — Progress Notes (Signed)
 Subjective:    Patient ID: Bailey Valdez, female    DOB: 1966/01/14, 58 y.o.   MRN: [id number]  Chief Complaint  Patient presents with   Urinary Tract Infection   Pt presents to the office today with UTI symptoms that started two weeks ago. She completed doxycycline  BID for 5 days. Reports this is did not help at all. She has Macrobid  for BID for 6 days that was helping. However, once she stopped her symptoms have returned.  Dysuria  This is a recurrent problem. The current episode started 1 to 4 weeks ago. The problem occurs intermittently. The quality of the pain is described as burning. The pain is at a severity of 5/10. The pain is mild. Associated symptoms include frequency and urgency. Pertinent negatives include no flank pain, hematuria, hesitancy, nausea or vomiting. She has tried antibiotics and increased fluids for the symptoms. The treatment provided mild relief. Her past medical history is significant for recurrent UTIs.      Review of Systems  Gastrointestinal:  Negative for nausea and vomiting.  Genitourinary:  Positive for dysuria, frequency and urgency. Negative for flank pain, hematuria and hesitancy.  All other systems reviewed and are negative.   Social History   Socioeconomic History   Marital status: Married    Spouse name: Not on file   Number of children: Not on file   Years of education: Not on file   Highest education level: Not on file  Occupational History   Not on file  Tobacco Use   Smoking status: Never   Smokeless tobacco: Never  Vaping Use   Vaping status: Never Used  Substance and Sexual Activity   Alcohol use: No    Alcohol/week: 0.0 standard drinks of alcohol   Drug use: No   Sexual activity: Not on file  Other Topics Concern   Not on file  Social History Narrative   Not on file   Social Drivers of Health   Financial Resource Strain: Not on file  Food Insecurity: Not on file  Transportation Needs: Not on file  Physical Activity:  Not on file  Stress: Not on file  Social Connections: Not on file   Family History  Problem Relation Age of Onset   Crohn's disease Mother    Heart disease Father    Colon cancer Maternal Uncle    Crohn's disease Maternal Uncle    Breast cancer Maternal Grandmother         Objective:   Physical Exam Vitals reviewed.  Constitutional:      General: She is not in acute distress.    Appearance: She is well-developed.  HENT:     Head: Normocephalic and atraumatic.  Eyes:     Pupils: Pupils are equal, round, and reactive to light.  Neck:     Thyroid : No thyromegaly.  Cardiovascular:     Rate and Rhythm: Normal rate and regular rhythm.     Heart sounds: Normal heart sounds. No murmur heard. Pulmonary:     Effort: Pulmonary effort is normal. No respiratory distress.     Breath sounds: Normal breath sounds. No wheezing.  Abdominal:     General: Bowel sounds are normal. There is no distension.     Palpations: Abdomen is soft.     Tenderness: There is no abdominal tenderness.  Musculoskeletal:        General: No tenderness. Normal range of motion.     Cervical back: Normal range of motion and neck supple.  Skin:    General: Skin is warm and dry.  Neurological:     Mental Status: She is alert and oriented to person, place, and time.     Cranial Nerves: No cranial nerve deficit.     Deep Tendon Reflexes: Reflexes are normal and symmetric.  Psychiatric:        Behavior: Behavior normal.        Thought Content: Thought content normal.        Judgment: Judgment normal.       BP 125/80   Pulse 76   Temp (!) 97 F (36.1 C) (Temporal)   Ht 5' 2 (1.575 m)   Wt 172 lb (78 kg)   BMI 31.46 kg/m      Assessment & Plan:  Bailey Valdez comes in today with chief complaint of Urinary Tract Infection   Diagnosis and orders addressed:  1. Dysuria - Urine Culture - Urinalysis, Complete  2. Acute cystitis without hematuria (Primary) Force fluids AZO over the counter X2  days RTO prn Culture pending - ciprofloxacin  (CIPRO ) 500 MG tablet; Take 1 tablet (500 mg total) by mouth 2 (two) times daily.  Dispense: 20 tablet; Refill: 0  3. Antibiotic-induced yeast infection - fluconazole  (DIFLUCAN ) 150 MG tablet; Take 1 tablet (150 mg total) by mouth every three (3) days as needed.  Dispense: 3 tablet; Refill: 0    Bari Learn, FNP

## 2023-11-27 NOTE — Patient Instructions (Signed)

## 2023-11-29 ENCOUNTER — Ambulatory Visit: Payer: Self-pay | Admitting: Family

## 2023-11-30 LAB — URINE CULTURE

## 2023-12-18 ENCOUNTER — Other Ambulatory Visit: Payer: Self-pay | Admitting: Family

## 2023-12-31 ENCOUNTER — Encounter: Payer: Self-pay | Admitting: Family

## 2023-12-31 ENCOUNTER — Other Ambulatory Visit: Payer: Self-pay | Admitting: Family Medicine

## 2023-12-31 ENCOUNTER — Other Ambulatory Visit (HOSPITAL_COMMUNITY): Payer: Self-pay

## 2023-12-31 ENCOUNTER — Other Ambulatory Visit: Payer: Self-pay

## 2023-12-31 ENCOUNTER — Ambulatory Visit (INDEPENDENT_AMBULATORY_CARE_PROVIDER_SITE_OTHER): Admitting: Family

## 2023-12-31 VITALS — BP 111/74 | HR 97 | Temp 97.4°F | Ht 62.0 in | Wt 174.0 lb

## 2023-12-31 DIAGNOSIS — F411 Generalized anxiety disorder: Secondary | ICD-10-CM

## 2023-12-31 DIAGNOSIS — I10 Essential (primary) hypertension: Secondary | ICD-10-CM

## 2023-12-31 DIAGNOSIS — E785 Hyperlipidemia, unspecified: Secondary | ICD-10-CM

## 2023-12-31 DIAGNOSIS — F5101 Primary insomnia: Secondary | ICD-10-CM | POA: Diagnosis not present

## 2023-12-31 DIAGNOSIS — Z0000 Encounter for general adult medical examination without abnormal findings: Secondary | ICD-10-CM

## 2023-12-31 DIAGNOSIS — F321 Major depressive disorder, single episode, moderate: Secondary | ICD-10-CM

## 2023-12-31 DIAGNOSIS — Z0001 Encounter for general adult medical examination with abnormal findings: Secondary | ICD-10-CM | POA: Diagnosis not present

## 2023-12-31 DIAGNOSIS — E079 Disorder of thyroid, unspecified: Secondary | ICD-10-CM

## 2023-12-31 LAB — LIPID PANEL

## 2023-12-31 MED ORDER — VILAZODONE HCL 40 MG PO TABS
20.0000 mg | ORAL_TABLET | Freq: Every day | ORAL | 3 refills | Status: AC
  Filled 2023-12-31: qty 90, 90d supply, fill #0

## 2023-12-31 MED ORDER — ZOLPIDEM TARTRATE 10 MG PO TABS
10.0000 mg | ORAL_TABLET | Freq: Every evening | ORAL | 1 refills | Status: AC | PRN
  Filled 2023-12-31 – 2024-01-08 (×2): qty 90, 90d supply, fill #0

## 2023-12-31 MED ORDER — NEBIVOLOL HCL 5 MG PO TABS
5.0000 mg | ORAL_TABLET | Freq: Every day | ORAL | 2 refills | Status: AC
  Filled 2023-12-31: qty 90, 90d supply, fill #0

## 2023-12-31 NOTE — Patient Instructions (Signed)

## 2023-12-31 NOTE — Progress Notes (Signed)
 Subjective:    Patient ID: Bailey Valdez, female    DOB: 08/28/1965, 58 y.o.   MRN: [id number]  Chief Complaint  Patient presents with   Medical Management of Chronic Issues   Pt presents to the office today for CPE without pap.    Pt currently taking tirzepatide  5 mg that she is buying online.   Her starting weight was 225 lb and now is 174 lb lbs. Patient's BMI is >30 mg/m2.  Patient's current BMI is Body mass index is 31.83 kg/m.SABRA  Patient has lost and maintained a 5% body weight loss.  Patient is currently enrolled in a healthy eating plan along with encouraged exercise.   Patient does not have a personal or family history of medullary thyroid  carcinoma (MTC) or Multiple Endocrine Neoplasia syndrome type 2 (MEN 2).  Hypertension This is a chronic problem. The current episode started more than 1 year ago. The problem has been resolved since onset. The problem is controlled. Associated symptoms include anxiety. Pertinent negatives include no malaise/fatigue, peripheral edema or shortness of breath. Risk factors for coronary artery disease include dyslipidemia, obesity and sedentary lifestyle. The current treatment provides moderate improvement.  Hyperlipidemia This is a chronic problem. The current episode started more than 1 year ago. The problem is uncontrolled. Recent lipid tests were reviewed and are high. Exacerbating diseases include obesity. Pertinent negatives include no shortness of breath. Current antihyperlipidemic treatment includes diet change. The current treatment provides no improvement of lipids. Risk factors for coronary artery disease include dyslipidemia.  Depression        This is a chronic problem.  The current episode started more than 1 year ago.   Associated symptoms include no decreased concentration, no helplessness, no hopelessness and not sad.  Past treatments include SNRIs - Serotonin and norepinephrine reuptake inhibitors.  Past medical history includes anxiety.    Anxiety Presents for follow-up visit. Patient reports no decreased concentration, dry mouth, irritability or shortness of breath. Symptoms occur occasionally. The severity of symptoms is mild.        Review of Systems  Constitutional:  Negative for irritability and malaise/fatigue.  Respiratory:  Negative for shortness of breath.   Psychiatric/Behavioral:  Negative for decreased concentration.   All other systems reviewed and are negative.  Family History  Problem Relation Age of Onset   Crohn's disease Mother    Heart disease Father    Colon cancer Maternal Uncle    Crohn's disease Maternal Uncle    Breast cancer Maternal Grandmother    Social History   Socioeconomic History   Marital status: Married    Spouse name: Not on file   Number of children: Not on file   Years of education: Not on file   Highest education level: Not on file  Occupational History   Not on file  Tobacco Use   Smoking status: Never   Smokeless tobacco: Never  Vaping Use   Vaping status: Never Used  Substance and Sexual Activity   Alcohol use: No    Alcohol/week: 0.0 standard drinks of alcohol   Drug use: No   Sexual activity: Not on file  Other Topics Concern   Not on file  Social History Narrative   Not on file   Social Drivers of Health   Financial Resource Strain: Not on file  Food Insecurity: Not on file  Transportation Needs: Not on file  Physical Activity: Not on file  Stress: Not on file  Social Connections: Not on file  Objective:   Physical Exam Vitals reviewed.  Constitutional:      General: She is not in acute distress.    Appearance: She is well-developed. She is obese. She is not ill-appearing.  HENT:     Head: Normocephalic and atraumatic.     Right Ear: Tympanic membrane normal.     Left Ear: Tympanic membrane normal.  Eyes:     Pupils: Pupils are equal, round, and reactive to light.  Neck:     Thyroid : No thyromegaly.  Cardiovascular:     Rate and  Rhythm: Normal rate and regular rhythm.     Heart sounds: Normal heart sounds. No murmur heard. Pulmonary:     Effort: Pulmonary effort is normal. No respiratory distress.     Breath sounds: Normal breath sounds. No wheezing.  Abdominal:     General: Bowel sounds are normal. There is no distension.     Palpations: Abdomen is soft.     Tenderness: There is no abdominal tenderness.  Musculoskeletal:        General: No tenderness. Normal range of motion.     Cervical back: Normal range of motion and neck supple.  Skin:    General: Skin is warm and dry.  Neurological:     Mental Status: She is alert and oriented to person, place, and time.     Cranial Nerves: No cranial nerve deficit.     Deep Tendon Reflexes: Reflexes are normal and symmetric.  Psychiatric:        Behavior: Behavior normal.        Thought Content: Thought content normal.        Judgment: Judgment normal.       BP 111/74   Pulse 97   Temp (!) 97.4 F (36.3 C) (Temporal)   Ht 5' 2 (1.575 m)   Wt 174 lb (78.9 kg)   BMI 31.83 kg/m      Assessment & Plan:  ARDICE BOYAN comes in today with chief complaint of Medical Management of Chronic Issues   Diagnosis and orders addressed: 1. Primary hypertension - CBC with Differential/Platelet - CMP14+EGFR - Lipid panel  2. Hyperlipidemia, unspecified hyperlipidemia type - Lipid panel  3. Mass of thyroid  gland - TSH  4. GAD (generalized anxiety disorder) - Vilazodone  HCl (VIIBRYD ) 40 MG TABS; Take 0.5-1 tablets (20-40 mg total) by mouth daily.  Dispense: 90 tablet; Refill: 3  5. Depression, major, single episode, moderate (HCC) - Vilazodone  HCl (VIIBRYD ) 40 MG TABS; Take 0.5-1 tablets (20-40 mg total) by mouth daily.  Dispense: 90 tablet; Refill: 3  6. Primary insomnia - zolpidem  (AMBIEN ) 10 MG tablet; Take 1 tablet (10 mg total) by mouth at bedtime as needed for sleep.  Dispense: 90 tablet; Refill: 1  7. Annual physical exam (Primary)   Labs  pending Continue Mounjaro   Health Maintenance reviewed Diet and exercise encouraged  Follow up plan: 6 months    Bari Learn, FNP

## 2024-01-01 ENCOUNTER — Other Ambulatory Visit: Payer: Self-pay

## 2024-01-01 ENCOUNTER — Ambulatory Visit: Payer: Self-pay | Admitting: Family

## 2024-01-01 LAB — CBC WITH DIFFERENTIAL/PLATELET
Basophils Absolute: 0.1 x10E3/uL (ref 0.0–0.2)
Basos: 1 %
EOS (ABSOLUTE): 0.2 x10E3/uL (ref 0.0–0.4)
Eos: 4 %
Hematocrit: 46.9 % — ABNORMAL HIGH (ref 34.0–46.6)
Hemoglobin: 14.9 g/dL (ref 11.1–15.9)
Immature Grans (Abs): 0 x10E3/uL (ref 0.0–0.1)
Immature Granulocytes: 0 %
Lymphocytes Absolute: 1.7 x10E3/uL (ref 0.7–3.1)
Lymphs: 32 %
MCH: 29.3 pg (ref 26.6–33.0)
MCHC: 31.8 g/dL (ref 31.5–35.7)
MCV: 92 fL (ref 79–97)
Monocytes Absolute: 0.6 x10E3/uL (ref 0.1–0.9)
Monocytes: 11 %
Neutrophils Absolute: 2.7 x10E3/uL (ref 1.4–7.0)
Neutrophils: 52 %
Platelets: 258 x10E3/uL (ref 150–450)
RBC: 5.09 x10E6/uL (ref 3.77–5.28)
RDW: 12.9 % (ref 11.7–15.4)
WBC: 5.2 x10E3/uL (ref 3.4–10.8)

## 2024-01-01 LAB — CMP14+EGFR
ALT: 13 IU/L (ref 0–32)
AST: 18 IU/L (ref 0–40)
Albumin: 4.3 g/dL (ref 3.8–4.9)
Alkaline Phosphatase: 71 IU/L (ref 44–121)
BUN/Creatinine Ratio: 16 (ref 12–28)
BUN: 12 mg/dL (ref 8–27)
Bilirubin Total: 0.5 mg/dL (ref 0.0–1.2)
CO2: 23 mmol/L (ref 20–29)
Calcium: 9.3 mg/dL (ref 8.7–10.3)
Chloride: 105 mmol/L (ref 96–106)
Creatinine, Ser: 0.77 mg/dL (ref 0.57–1.00)
Globulin, Total: 2.4 g/dL (ref 1.5–4.5)
Glucose: 85 mg/dL (ref 70–99)
Potassium: 3.9 mmol/L (ref 3.5–5.2)
Sodium: 142 mmol/L (ref 134–144)
Total Protein: 6.7 g/dL (ref 6.0–8.5)
eGFR: 88 mL/min/1.73 (ref >59)

## 2024-01-01 LAB — LIPID PANEL
Cholesterol, Total: 170 mg/dL (ref 100–199)
HDL: 46 mg/dL (ref >39)
LDL CALC COMMENT:: 3.7 ratio (ref 0.0–4.4)
LDL Chol Calc (NIH): 108 mg/dL — AB (ref 0–99)
Triglycerides: 83 mg/dL (ref 0–149)
VLDL Cholesterol Cal: 16 mg/dL (ref 5–40)

## 2024-01-01 LAB — TSH: TSH: 2.74 u[IU]/mL (ref 0.450–4.500)

## 2024-01-08 ENCOUNTER — Other Ambulatory Visit (HOSPITAL_COMMUNITY): Payer: Self-pay

## 2024-02-25 ENCOUNTER — Other Ambulatory Visit: Payer: Self-pay | Admitting: Family Medicine

## 2024-02-25 DIAGNOSIS — E079 Disorder of thyroid, unspecified: Secondary | ICD-10-CM

## 2024-03-05 ENCOUNTER — Ambulatory Visit (HOSPITAL_COMMUNITY)
Admission: RE | Admit: 2024-03-05 | Discharge: 2024-03-05 | Disposition: A | Discharge status: Home / Self Care | Source: Ambulatory Visit | Attending: Family Medicine | Admitting: Family Medicine

## 2024-03-05 DIAGNOSIS — R221 Localized swelling, mass and lump, neck: Secondary | ICD-10-CM | POA: Diagnosis not present

## 2024-03-05 DIAGNOSIS — E079 Disorder of thyroid, unspecified: Secondary | ICD-10-CM | POA: Insufficient documentation

## 2024-03-06 ENCOUNTER — Ambulatory Visit (HOSPITAL_COMMUNITY)

## 2024-03-10 ENCOUNTER — Ambulatory Visit: Payer: Self-pay | Admitting: Family Medicine

## 2024-03-12 DIAGNOSIS — H35371 Puckering of macula, right eye: Secondary | ICD-10-CM | POA: Diagnosis not present

## 2024-03-12 DIAGNOSIS — D3131 Benign neoplasm of right choroid: Secondary | ICD-10-CM | POA: Diagnosis not present

## 2024-03-12 DIAGNOSIS — H5203 Hypermetropia, bilateral: Secondary | ICD-10-CM | POA: Diagnosis not present

## 2024-03-12 DIAGNOSIS — H52223 Regular astigmatism, bilateral: Secondary | ICD-10-CM | POA: Diagnosis not present

## 2024-03-12 DIAGNOSIS — H524 Presbyopia: Secondary | ICD-10-CM | POA: Diagnosis not present

## 2024-03-13 ENCOUNTER — Ambulatory Visit (INDEPENDENT_AMBULATORY_CARE_PROVIDER_SITE_OTHER)

## 2024-03-13 DIAGNOSIS — Z23 Encounter for immunization: Secondary | ICD-10-CM | POA: Diagnosis not present

## 2024-04-02 DIAGNOSIS — G5603 Carpal tunnel syndrome, bilateral upper limbs: Secondary | ICD-10-CM | POA: Diagnosis not present

## 2024-04-12 DIAGNOSIS — G5602 Carpal tunnel syndrome, left upper limb: Secondary | ICD-10-CM | POA: Diagnosis not present

## 2024-04-12 DIAGNOSIS — G5601 Carpal tunnel syndrome, right upper limb: Secondary | ICD-10-CM | POA: Diagnosis not present

## 2024-04-12 DIAGNOSIS — G5603 Carpal tunnel syndrome, bilateral upper limbs: Secondary | ICD-10-CM | POA: Diagnosis not present
